# Patient Record
Sex: Female | Born: 1945 | Race: White | Hispanic: No | Marital: Single | Smoking: Former smoker
Health system: Southern US, Community
[De-identification: ages and names within clinical notes are randomized; demographics above are authoritative.]

## PROBLEM LIST (undated history)

## (undated) DIAGNOSIS — F419 Anxiety disorder, unspecified: Secondary | ICD-10-CM

## (undated) DIAGNOSIS — K5790 Diverticulosis of intestine, part unspecified, without perforation or abscess without bleeding: Secondary | ICD-10-CM

## (undated) DIAGNOSIS — T7840XA Allergy, unspecified, initial encounter: Secondary | ICD-10-CM

## (undated) DIAGNOSIS — I10 Essential (primary) hypertension: Secondary | ICD-10-CM

## (undated) DIAGNOSIS — Q438 Other specified congenital malformations of intestine: Secondary | ICD-10-CM

## (undated) DIAGNOSIS — R7303 Prediabetes: Secondary | ICD-10-CM

## (undated) DIAGNOSIS — T4145XA Adverse effect of unspecified anesthetic, initial encounter: Secondary | ICD-10-CM

## (undated) DIAGNOSIS — M810 Age-related osteoporosis without current pathological fracture: Secondary | ICD-10-CM

## (undated) DIAGNOSIS — M8580 Other specified disorders of bone density and structure, unspecified site: Secondary | ICD-10-CM

## (undated) DIAGNOSIS — H269 Unspecified cataract: Secondary | ICD-10-CM

## (undated) DIAGNOSIS — C50919 Malignant neoplasm of unspecified site of unspecified female breast: Secondary | ICD-10-CM

## (undated) DIAGNOSIS — D563 Thalassemia minor: Secondary | ICD-10-CM

## (undated) DIAGNOSIS — E785 Hyperlipidemia, unspecified: Secondary | ICD-10-CM

## (undated) DIAGNOSIS — M1990 Unspecified osteoarthritis, unspecified site: Secondary | ICD-10-CM

## (undated) DIAGNOSIS — C4490 Unspecified malignant neoplasm of skin, unspecified: Secondary | ICD-10-CM

## (undated) DIAGNOSIS — H409 Unspecified glaucoma: Secondary | ICD-10-CM

## (undated) DIAGNOSIS — T8859XA Other complications of anesthesia, initial encounter: Secondary | ICD-10-CM

## (undated) HISTORY — DX: Essential (primary) hypertension: I10

## (undated) HISTORY — DX: Allergy, unspecified, initial encounter: T78.40XA

## (undated) HISTORY — DX: Unspecified glaucoma: H40.9

## (undated) HISTORY — DX: Thalassemia minor: D56.3

## (undated) HISTORY — DX: Unspecified malignant neoplasm of skin, unspecified: C44.90

## (undated) HISTORY — DX: Unspecified cataract: H26.9

## (undated) HISTORY — PX: BREAST BIOPSY: SHX20

## (undated) HISTORY — DX: Unspecified osteoarthritis, unspecified site: M19.90

## (undated) HISTORY — PX: APPENDECTOMY: SHX54

## (undated) HISTORY — DX: Age-related osteoporosis without current pathological fracture: M81.0

---
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
## 1993-02-05 HISTORY — PX: OTHER SURGICAL HISTORY: SHX169

## 1999-02-06 HISTORY — PX: CATARACT EXTRACTION: SUR2

## 2003-02-06 HISTORY — PX: PAROTID GLAND TUMOR EXCISION: SHX5221

## 2003-02-06 HISTORY — PX: COLONOSCOPY: SHX174

## 2003-11-08 ENCOUNTER — Other Ambulatory Visit: Payer: Self-pay

## 2003-11-18 ENCOUNTER — Ambulatory Visit: Payer: Self-pay | Admitting: Otolaryngology

## 2003-12-11 ENCOUNTER — Ambulatory Visit: Payer: Self-pay | Admitting: Internal Medicine

## 2007-12-30 ENCOUNTER — Ambulatory Visit: Payer: Self-pay | Admitting: Ophthalmology

## 2008-01-12 ENCOUNTER — Ambulatory Visit: Payer: Self-pay | Admitting: Ophthalmology

## 2009-02-05 HISTORY — PX: CATARACT EXTRACTION: SUR2

## 2013-01-23 ENCOUNTER — Ambulatory Visit: Payer: Self-pay | Admitting: Otolaryngology

## 2013-02-05 HISTORY — PX: COLONOSCOPY: SHX174

## 2013-06-23 ENCOUNTER — Ambulatory Visit: Payer: Self-pay | Admitting: Gastroenterology

## 2015-09-30 ENCOUNTER — Telehealth: Payer: Self-pay | Admitting: *Deleted

## 2015-09-30 NOTE — Telephone Encounter (Signed)
Received referral for low dose lung cancer screening CT scan. Voicemail left at phone number listed in EMR for patient to call me back to facilitate scheduling scan.  

## 2015-10-12 ENCOUNTER — Telehealth: Payer: Self-pay | Admitting: *Deleted

## 2015-10-12 NOTE — Telephone Encounter (Signed)
Message sent to patient letting her know that she does not meet criteria for screening due to >15 years since her quit date from smoking. And doing screening when criteria is not met risk doing harm with radiation exposure. Encouraged patient to call if she would like for further discussion. 

## 2016-10-03 ENCOUNTER — Other Ambulatory Visit: Payer: Self-pay | Admitting: Obstetrics and Gynecology

## 2016-10-03 DIAGNOSIS — Z1231 Encounter for screening mammogram for malignant neoplasm of breast: Secondary | ICD-10-CM

## 2016-10-24 ENCOUNTER — Encounter: Payer: Self-pay | Admitting: Radiology

## 2016-10-24 ENCOUNTER — Ambulatory Visit
Admission: RE | Admit: 2016-10-24 | Discharge: 2016-10-24 | Disposition: A | Discharge status: Home / Self Care | Payer: Medicare Other | Source: Ambulatory Visit | Attending: Obstetrics and Gynecology | Admitting: Obstetrics and Gynecology

## 2016-10-24 DIAGNOSIS — Z1231 Encounter for screening mammogram for malignant neoplasm of breast: Secondary | ICD-10-CM | POA: Diagnosis not present

## 2016-10-24 DIAGNOSIS — R928 Other abnormal and inconclusive findings on diagnostic imaging of breast: Secondary | ICD-10-CM | POA: Insufficient documentation

## 2016-10-29 ENCOUNTER — Inpatient Hospital Stay
Admission: RE | Admit: 2016-10-29 | Discharge: 2016-10-29 | Disposition: A | Discharge status: Home / Self Care | Payer: Self-pay | Source: Ambulatory Visit | Attending: *Deleted | Admitting: *Deleted

## 2016-10-29 ENCOUNTER — Other Ambulatory Visit: Payer: Self-pay | Admitting: *Deleted

## 2016-10-29 DIAGNOSIS — Z9289 Personal history of other medical treatment: Secondary | ICD-10-CM

## 2016-10-31 ENCOUNTER — Other Ambulatory Visit: Payer: Self-pay | Admitting: Obstetrics and Gynecology

## 2016-10-31 DIAGNOSIS — N6320 Unspecified lump in the left breast, unspecified quadrant: Secondary | ICD-10-CM

## 2016-10-31 DIAGNOSIS — R928 Other abnormal and inconclusive findings on diagnostic imaging of breast: Secondary | ICD-10-CM

## 2016-11-08 ENCOUNTER — Ambulatory Visit
Admission: RE | Admit: 2016-11-08 | Discharge: 2016-11-08 | Disposition: A | Discharge status: Home / Self Care | Payer: Medicare Other | Source: Ambulatory Visit | Attending: Obstetrics and Gynecology | Admitting: Obstetrics and Gynecology

## 2016-11-08 DIAGNOSIS — N6320 Unspecified lump in the left breast, unspecified quadrant: Secondary | ICD-10-CM

## 2016-11-08 DIAGNOSIS — R928 Other abnormal and inconclusive findings on diagnostic imaging of breast: Secondary | ICD-10-CM

## 2016-11-08 DIAGNOSIS — N6321 Unspecified lump in the left breast, upper outer quadrant: Secondary | ICD-10-CM | POA: Diagnosis not present

## 2016-11-12 ENCOUNTER — Other Ambulatory Visit: Payer: Self-pay | Admitting: Obstetrics and Gynecology

## 2016-11-12 DIAGNOSIS — R928 Other abnormal and inconclusive findings on diagnostic imaging of breast: Secondary | ICD-10-CM

## 2016-11-12 DIAGNOSIS — N6320 Unspecified lump in the left breast, unspecified quadrant: Secondary | ICD-10-CM

## 2016-11-21 ENCOUNTER — Ambulatory Visit
Admission: RE | Admit: 2016-11-21 | Discharge: 2016-11-21 | Disposition: A | Discharge status: Home / Self Care | Payer: Medicare Other | Source: Ambulatory Visit | Attending: Obstetrics and Gynecology | Admitting: Obstetrics and Gynecology

## 2016-11-21 DIAGNOSIS — N6320 Unspecified lump in the left breast, unspecified quadrant: Secondary | ICD-10-CM | POA: Diagnosis present

## 2016-11-21 DIAGNOSIS — C50412 Malignant neoplasm of upper-outer quadrant of left female breast: Secondary | ICD-10-CM | POA: Insufficient documentation

## 2016-11-21 DIAGNOSIS — R928 Other abnormal and inconclusive findings on diagnostic imaging of breast: Secondary | ICD-10-CM

## 2016-11-22 LAB — SURGICAL PATHOLOGY

## 2016-11-22 NOTE — Progress Notes (Signed)
  Oncology Nurse Navigator Documentation  Navigator Location: [facility] (11/22/16 1600)   )Navigator Encounter Type: Introductory phone call (11/22/16 1600)   Abnormal Finding Date: 11/08/16 (11/22/16 1600) Confirmed Diagnosis Date: 11/21/16 (11/22/16 1600)               Patient Visit Type: Initial (11/22/16 1600) Treatment Phase: Pre-Tx/Tx Discussion (11/22/16 1600) Barriers/Navigation Needs: Education;Coordination of Care (11/22/16 1600) Education: Coping with Diagnosis/ Prognosis;Accessing Care/ Finding Providers;Newly Diagnosed Cancer Education (11/22/16 1600) Interventions: Coordination of Care;Education (11/22/16 1600)   Coordination of Care: Appts (11/22/16 1600) Education Method: Verbal;Teach-back;Written (11/22/16 1600)                Time Spent with Patient: 45 (11/22/16 1600)   Patient received biopsy results from Radiologist.  Phoned her to introduce navigation service. Scheduled to see Dr. Tamala Julian on 11/27/16 at 1:30.  She will receive Breast Cancer Treatment Handbook/folder with hospital services at that appointment.  Messaged schedulers to schedule Med/Onc visit after Surgical consult.

## 2016-11-27 ENCOUNTER — Other Ambulatory Visit (HOSPITAL_COMMUNITY): Payer: Self-pay | Admitting: Surgery

## 2016-11-28 ENCOUNTER — Encounter: Payer: Self-pay | Admitting: Hematology and Oncology

## 2016-11-28 ENCOUNTER — Inpatient Hospital Stay: Payer: Medicare Other | Attending: Hematology and Oncology | Admitting: Hematology and Oncology

## 2016-11-28 VITALS — BP 138/89 | HR 91 | Temp 98.0°F | Resp 20 | Ht 67.0 in | Wt 193.1 lb

## 2016-11-28 DIAGNOSIS — Z85828 Personal history of other malignant neoplasm of skin: Secondary | ICD-10-CM | POA: Diagnosis not present

## 2016-11-28 DIAGNOSIS — Z79899 Other long term (current) drug therapy: Secondary | ICD-10-CM | POA: Diagnosis not present

## 2016-11-28 DIAGNOSIS — D509 Iron deficiency anemia, unspecified: Secondary | ICD-10-CM

## 2016-11-28 DIAGNOSIS — D563 Thalassemia minor: Secondary | ICD-10-CM

## 2016-11-28 DIAGNOSIS — H409 Unspecified glaucoma: Secondary | ICD-10-CM | POA: Insufficient documentation

## 2016-11-28 DIAGNOSIS — D0512 Intraductal carcinoma in situ of left breast: Secondary | ICD-10-CM

## 2016-11-28 DIAGNOSIS — Z7982 Long term (current) use of aspirin: Secondary | ICD-10-CM | POA: Diagnosis not present

## 2016-11-28 DIAGNOSIS — M8580 Other specified disorders of bone density and structure, unspecified site: Secondary | ICD-10-CM

## 2016-11-28 DIAGNOSIS — I10 Essential (primary) hypertension: Secondary | ICD-10-CM | POA: Diagnosis not present

## 2016-11-28 DIAGNOSIS — Z87891 Personal history of nicotine dependence: Secondary | ICD-10-CM | POA: Diagnosis not present

## 2016-11-28 NOTE — Progress Notes (Signed)
Patient here today as new evaulation regarding high grade DCIS.  Referred by Dr. Wonda Cerise.  Patient accompanied by her sister for today's visit.

## 2016-11-28 NOTE — Progress Notes (Signed)
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 Laverta Baltimore for assessment and management.  HPI:  The patient performs self breast exams.  She undergoes yearly mammograms.  Bilateral screening mammogram on 10/24/2016 revealed a possible mass with distortion in the left breast.  Diagnostic mammogram and ultrasound on 11/08/2016 revealed a 7 x 5 x 3 mm mildly irregular, hypoechoic mass in the 1:30 o'clock position of the left breast. There were areas of questionable distortion in the upper outer left breast and small questionable mass like area in the 3:00 position of the left breast posteriorly. There was no sonographic correlate.  She underwent ultrasound-guided breast biopsy on 11/21/2016.   Clip was placed.  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   She was seen by Dr. Leodis Binet on 11/27/2016.  She was scheduled for bilateral breast MRI.  Discussions were held regarding lumpectomy versus mastectomy. Patient is claustrophobic. She advises that the study will require sedation with Propofol, however they cannot schedule it until 12/10/2016.  Labs on 11/27/2016 revealed a hematocrit 38.1, hemoglobin 12.1, MCV 61.3, platelets 316,000, white count 7500 with an [place] of 4250.    She has a history of thalassemia minor. Patient's father had the same diagnosis, mother did not. There are a total of 5 children born to patient's parents, and 3 of the 5 have a thalassemia diagnosis. Patient has never required blood transfusions.  Symptomatically, patient is doing well overall. She denies bruising or bleeding; no hematochezia, melena, or vaginal bleeding. She last underwent a colonoscopy in 2015. Patient denies B symptoms and interval infections. Patient eating well, with no recent weight loss.   She has a positive family history of  breast cancer. Her mother was diagnosed at age 27. Mother devcloped lymphoma 10 years later. Sister developed uterine cancer at age 75. Maternal grandmother had uterine cancer at an undetermined age.   Menarche was at approximately age 16. Menopause at age 39.  Patient is nulliparous.    Past Medical History:  Diagnosis Date  . Allergy   . Arthritis   . Cataract   . Glaucoma   . Hypertension   . Osteoporosis   . Skin cancer   . Thalassemia minor     Past Surgical History:  Procedure Laterality Date  . ADENOIDECTOMY  1956  . APPENDECTOMY    . basal cell removal  1995  . BREAST BIOPSY Right 1990s   neg. result.   Marland Kitchen CATARACT EXTRACTION Right 2001  . CATARACT EXTRACTION Left 2011  . COLONOSCOPY  2005  . COLONOSCOPY  2015  . PAROTID GLAND TUMOR EXCISION  2005    Family History  Problem Relation Age of Onset  . Breast cancer Mother   . Lymphoma Mother   . Kidney cancer Father   . Endometrial cancer Sister   . Endometrial cancer Maternal Grandmother     Social History:  reports that she quit smoking about 19 years ago. She has a 33.00 pack-year smoking history. She does not have any smokeless tobacco history on file. She reports that she drinks about 3.0 oz of alcohol per week . She reports that she does not use drugs. 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  She likes to hike.  She previously smoked 1 pack/day x 33 years.  She stopped smoking in 08/99.  She drinks 5-7 glasses wine/week.  The patient is accompanied by her sister, Lelon Frohlich, today.  Allergies:  Allergies  Allergen Reactions  . Dust Mite Extract Cough    Also watery eyes and sinus congestion  . Mold Extract [Trichophyton] Cough    Also sinus congestion and watery eyes.  . Statins Other (See Comments)    Muscle pain  . Ativan [Lorazepam] Anxiety  . E-Mycin [Erythromycin] Rash  . Prozac [Fluoxetine Hcl] Anxiety  . Valium [Diazepam] Anxiety  .  Xanax [Alprazolam] Anxiety    Current Medications: Current Outpatient Prescriptions  Medication Sig Dispense Refill  . aspirin 325 MG tablet Take 325 mg by mouth as needed.    . cyclobenzaprine (FLEXERIL) 5 MG tablet Take 5 mg by mouth daily.    . sodium chloride (OCEAN) 0.65 % SOLN nasal spray Place 1 spray into both nostrils as needed for congestion.    . triamterene-hydrochlorothiazide (DYAZIDE) 37.5-25 MG capsule Take 1 capsule by mouth daily.    Marland Kitchen aspirin EC 81 MG tablet Take 81 mg by mouth daily.    . butabarbital (BUTISOL SODIUM) 30 MG tablet Take 30 mg by mouth 2 (two) times daily.    . Calcium Carbonate-Vit D-Min (CALTRATE 600+D PLUS PO) Take 2 tablets by mouth daily.    . diphenhydrAMINE (BENADRYL) 25 mg capsule Take 25 mg by mouth as needed.    Marland Kitchen ibuprofen (ADVIL,MOTRIN) 200 MG tablet Take 200 mg by mouth as needed.    . Misc Natural Products (GLUCOSAMINE CHOND COMPLEX/MSM) TABS Take 1 tablet by mouth 4 (four) times daily.    . Multiple Vitamins-Minerals (CENTRUM SILVER ULTRA WOMENS) TABS Take 1 tablet by mouth daily.    Marland Kitchen oxymetazoline (AFRIN) 0.05 % nasal spray Place 1 spray into the nose 2 (two) times daily.    . TH COD LIVER OIL CAPS Take 1 tablet by mouth daily.    . timolol (TIMOPTIC) 0.5 % ophthalmic solution Place 1 drop into both eyes daily.    . vitamin E 400 UNIT capsule Take 400 Units by mouth daily.     No current facility-administered medications for this visit.     Review of Systems:  GENERAL:  Feels good.  Slight decrease energy associated with thalassemia.  No fevers, sweats or weight loss. PERFORMANCE STATUS (ECOG):  0 HEENT:  Allergies.  No visual changes, runny nose, sore throat, mouth sores or tenderness. Lungs: No shortness of breath or cough.  No hemoptysis. Cardiac:  No chest pain, palpitations, orthopnea, or PND. GI:  No nausea, vomiting, diarrhea, constipation, melena or hematochezia.  Colonoscopy 06/23/2013. GU:  No urgency, frequency, dysuria, or  hematuria. Musculoskeletal:  No back pain.  Arthritis.  No muscle tenderness. Extremities:  No pain or swelling. Skin:  Multiple moles.  No rashes or skin changes. Neuro:  No headache, numbness or weakness, balance or coordination issues. Endocrine:  No diabetes, thyroid issues, hot flashes or night sweats. Psych:  No mood changes, depression or anxiety. Pain:  No focal pain. Review of systems:  All other systems reviewed and found to be negative.  Physical Exam: Blood pressure 138/89, pulse 91, temperature 98 F (36.7 C), temperature source Tympanic, resp. rate 20, height 5\' 7"  (1.702 m), weight 193 lb 2 oz (87.6 kg). GENERAL:  Well developed, well nourished, woman sitting comfortably in the exam room in no acute distress. MENTAL STATUS:  Alert and oriented to person, place and time. HEAD:  Styled shoulder length gray hair.  Normocephalic, atraumatic, face symmetric, no Cushingoid features. EYES:  Glasses.  Blue  eyes.  Pupils equal round and reactive to light and accomodation.  No conjunctivitis or scleral icterus. ENT:  Oropharynx clear without lesion.  Tongue normal. Mucous membranes moist.  RESPIRATORY:  Clear to auscultation without rales, wheezes or rhonchi. CARDIOVASCULAR:  Regular rate and rhythm without murmur, rub or gallop. BREAST:  Right breast without masses, skin changes or nipple discharge.  Left breast s/p biopsy with resolving ecchymosis at the 3 o'clock position.  No masses, skin changes or nipple discharge. ABDOMEN:  Soft, non-tender, with active bowel sounds, and no hepatosplenomegaly.  No masses. SKIN:  Multiple moles.  No rashes, ulcers or lesions. EXTREMITIES: No edema, no skin discoloration or tenderness.  No palpable cords. LYMPH NODES: No palpable cervical, supraclavicular, axillary or inguinal adenopathy  NEUROLOGICAL: Unremarkable. PSYCH:  Appropriate.   No visits with results within 3 Day(s) from this visit.  Latest known visit with results is:  Hospital  Outpatient Visit on 11/21/2016  Component Date Value Ref Range Status  . SURGICAL PATHOLOGY 11/21/2016    Final                   Value:Surgical Pathology CASE: [id number] PATIENT: Sandra Potter Surgical Pathology Report     SPECIMEN SUBMITTED: A. Breast, left, 1:30  CLINICAL HISTORY: Left breast mass  PRE-OPERATIVE DIAGNOSIS: FCC (complicated cyst) vs fat necrosis vs CA  POST-OPERATIVE DIAGNOSIS: None provided.     DIAGNOSIS: A. BREAST, LEFT 1:30; ULTRASOUND GUIDED BIOPSY: - HIGH GRADE DUCTAL CARCINOMA IN SITU, COMEDO TYPE.  Comment: These results were communicated to [facility] in Dr. Reynolds Bowl office on 11/22/2016. Read back procedure was performed. ER and PR will be deferred to an excision specimen.   GROSS DESCRIPTION:  A. The specimen is received in a formalin-filled container labeled with the patient's name and left breast 1:30, 4 cm from nipple.  Core pieces: multiple, aggregate 1.8 x 0.7 x 0.1 cm Comments: yellow to pink fragments, marked green  Entirely submitted in cassette(s): 1  Time/Date in fixative: collected and placed in formalin at 1335 on 11/21/2016 Total                          fixation time: 7 hours   Final Diagnosis performed by Quay Burow, MD.  Electronically signed 11/22/2016 1:33:11PM    The electronic signature indicates that the named Attending Pathologist has evaluated the specimen  Technical component performed at [facility], [address], [place], [place] [address] Lab: [id number] Dir: Darrick Penna. Evette Doffing, MD  Professional component performed at [facility], [facility], [place], [place], [place] [address] Lab: [id number] Dir: Dellia Nims. Reuel Derby, MD      Assessment:  Sandra Potter is a 71 y.o. 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   Bilateral screening mammogram on 10/24/2016 revealed a  possible mass with distortion in the left breast.  Diagnostic mammogram and ultrasound on 11/08/2016 revealed a 7 x 5 x 3 mm mildly irregular, hypoechoic mass in the 1:30 o'clock position of the left breast. There were areas of questionable distortion in the upper outer left breast and small questionable mass like area in the 3:00 position of the left breast posteriorly. There was no sonographic correlate.    She is being scheduled for a breast MRI.  Bone density study on 11/01/2015 revealed osteopenia with  a T-score of -2.3 in the left femoral neck, -2.1 in the left hip, and -0.7 in the AP spine L1-L4.  She has thalassemia minor.  She has a mild microcytic anemia.  Symptomatically, she notes mild fatigue.  Exam is unremarkable except for post biopsy changes in the left breast.  Plan: 1.  Discuss DCIS diagnosis and treatment plan going forward. Discuss surgical options that include lumpectomy versus mastectomy. If she pursues lumpectomy, patient will require radiation. Following surgery, if patient is hormone receptor positive, we will anticipate using aromatase inhibitor x 5 years. Discuss alternative of using tamoxifen given patient's osteopenia.  2.  Breast MRI with sedation scheduled for 12/10/2016.  She has claustrophobia.  MRI to assess additional area of distortion in the left upper outer quadrant. 3.  Discuss bone thinning associated with aromatase inhibitor therapy. Patient has taken Fosamax in the past, however had to discontinue due to painful ambulation. Patient not willing to try another oral bisphosphonate. She is not willing to consider Prolia as an alternative. Patient is taking calcium and vitamin D daily. She remains active.  4.  RTC 3 weeks after breast surgery.  Patient to call for appointment.    Honor Loh, NP  11/28/2016, 3:12 PM   I saw and evaluated the patient, participating in the key portions of the service and reviewing pertinent diagnostic studies and records.  I  reviewed the nurse practitioner's note and agree with the findings and the plan.  The assessment and plan were discussed with the patient.  Multiple questions were asked by the patient and answered.   Nolon Stalls, MD 11/28/2016,5:37 PM

## 2016-11-29 ENCOUNTER — Other Ambulatory Visit (HOSPITAL_COMMUNITY): Payer: Self-pay | Admitting: Surgery

## 2016-11-29 DIAGNOSIS — D0512 Intraductal carcinoma in situ of left breast: Secondary | ICD-10-CM

## 2016-11-30 ENCOUNTER — Other Ambulatory Visit: Payer: Self-pay

## 2016-12-12 NOTE — Pre-Procedure Instructions (Addendum)
    DAYLAH SAYAVONG  12/12/2016      MEDICAL [address] [place], [place] - [place] [address] Phone: [phone] Fax: [phone]    Your procedure is scheduled on Tuesday November 13.  Report to [facility] Admitting at 8:15 A.M.  Call this number if you have problems the morning of surgery:  [phone]   Remember:  Do not eat food or drink liquids after midnight.  Take these medicines the morning of surgery with A SIP OF WATER:   Cyclobenzaprine (flexeril) Afrin nasal spray if needed Ocean nasal spray if needed Benadryl if needed Eye drops if needed Aspirin  7 days prior to procedure STOP taking Vitamin E and multivitamins.      Do not wear jewelry, make-up or nail polish.  Do not wear lotions, powders, or perfumes, or deoderant.  Do not shave 48 hours prior to surgery.  Men may shave face and neck.  Do not bring valuables to the hospital.  [facility] is not responsible for any belongings or valuables.  Contacts, dentures or bridgework may not be worn into surgery.  Leave your suitcase in the car.  After surgery it may be brought to your room.  For patients admitted to the hospital, discharge time will be determined by your treatment team.  Patients discharged the day of surgery will not be allowed to drive home.     Please read over the following fact sheets that you were given. Coughing and Deep Breathing

## 2016-12-13 ENCOUNTER — Encounter (HOSPITAL_COMMUNITY): Payer: Self-pay

## 2016-12-13 ENCOUNTER — Encounter (HOSPITAL_COMMUNITY)
Admission: RE | Admit: 2016-12-13 | Discharge: 2016-12-13 | Disposition: A | Discharge status: Home / Self Care | Payer: Medicare Other | Source: Ambulatory Visit | Attending: Surgery | Admitting: Surgery

## 2016-12-13 ENCOUNTER — Other Ambulatory Visit: Payer: Self-pay

## 2016-12-13 DIAGNOSIS — Z01818 Encounter for other preprocedural examination: Secondary | ICD-10-CM | POA: Diagnosis not present

## 2016-12-13 DIAGNOSIS — I119 Hypertensive heart disease without heart failure: Secondary | ICD-10-CM | POA: Insufficient documentation

## 2016-12-13 DIAGNOSIS — N6320 Unspecified lump in the left breast, unspecified quadrant: Secondary | ICD-10-CM | POA: Insufficient documentation

## 2016-12-13 DIAGNOSIS — Z7982 Long term (current) use of aspirin: Secondary | ICD-10-CM | POA: Insufficient documentation

## 2016-12-13 DIAGNOSIS — Z79899 Other long term (current) drug therapy: Secondary | ICD-10-CM | POA: Insufficient documentation

## 2016-12-13 HISTORY — DX: Other complications of anesthesia, initial encounter: T88.59XA

## 2016-12-13 HISTORY — DX: Adverse effect of unspecified anesthetic, initial encounter: T41.45XA

## 2016-12-13 HISTORY — DX: Malignant neoplasm of unspecified site of unspecified female breast: C50.919

## 2016-12-13 HISTORY — DX: Anxiety disorder, unspecified: F41.9

## 2016-12-13 LAB — BASIC METABOLIC PANEL
Anion gap: 10 (ref 5–15)
BUN: 13 mg/dL (ref 6–20)
CHLORIDE: 93 mmol/L — AB (ref 101–111)
CO2: 25 mmol/L (ref 22–32)
CREATININE: 0.54 mg/dL (ref 0.44–1.00)
Calcium: 9 mg/dL (ref 8.9–10.3)
GFR calc Af Amer: >60 mL/min (ref >60)
GLUCOSE: 105 mg/dL — AB (ref 65–99)
Potassium: 3.6 mmol/L (ref 3.5–5.1)
SODIUM: 128 mmol/L — AB (ref 135–145)

## 2016-12-13 LAB — HEMOGLOBIN A1C
Hgb A1c MFr Bld: 5.8 % — ABNORMAL HIGH (ref 4.8–5.6)
Mean Plasma Glucose: 119.76 mg/dL

## 2016-12-13 LAB — CBC
HEMATOCRIT: 36.8 % (ref 36.0–46.0)
HEMOGLOBIN: 11.8 g/dL — AB (ref 12.0–15.0)
MCH: 19.3 pg — AB (ref 26.0–34.0)
MCHC: 32.1 g/dL (ref 30.0–36.0)
MCV: 60.2 fL — AB (ref 78.0–100.0)
Platelets: 288 10*3/uL (ref 150–400)
RBC: 6.11 MIL/uL — AB (ref 3.87–5.11)
RDW: 17 % — ABNORMAL HIGH (ref 11.5–15.5)
WBC: 6.6 10*3/uL (ref 4.0–10.5)

## 2016-12-13 LAB — GLUCOSE, CAPILLARY: GLUCOSE-CAPILLARY: 118 mg/dL — AB (ref 65–99)

## 2016-12-13 NOTE — Progress Notes (Signed)
PCP - no PCP currently, sees Joellen Jersey - denies  EKG- 12/13/2016  Echo- normal echo more than 10 years ago per patient, denies cardiac history.   Fasting Blood Sugar - 130s fasting, 2 hr after eating states it is less than 120, A1c drawn today.   Patient denies shortness of breath, fever, cough and chest pain at PAT appointment   Patient verbalized understanding of instructions that were given to them at the PAT appointment. Patient was also instructed that they will need to review over the PAT instructions again at home before surgery.

## 2016-12-13 NOTE — Progress Notes (Signed)
   How to Manage Your Diabetes Before and After Surgery  Why is it important to control my blood sugar before and after surgery? . Improving blood sugar levels before and after surgery helps healing and can limit problems. . A way of improving blood sugar control is eating a healthy diet by: o  Eating less sugar and carbohydrates o  Increasing activity/exercise o  Talking with your doctor about reaching your blood sugar goals . High blood sugars (greater than 180 mg/dL) can raise your risk of infections and slow your recovery, so you will need to focus on controlling your diabetes during the weeks before surgery. . Make sure that the doctor who takes care of your diabetes knows about your planned surgery including the date and location.  How do I manage my blood sugar before surgery? . Check your blood sugar at least 4 times a day, starting 2 days before surgery, to make sure that the level is not too high or low. o Check your blood sugar the morning of your surgery when you wake up and every 2 hours until you get to the Short Stay unit. . If your blood sugar is less than 70 mg/dL, you will need to treat for low blood sugar: o Do not take insulin. o Treat a low blood sugar (less than 70 mg/dL) with  cup of clear juice (cranberry or apple), 4 glucose tablets, OR glucose gel. Recheck blood sugar in 15 minutes after treatment (to make sure it is greater than 70 mg/dL). If your blood sugar is not greater than 70 mg/dL on recheck, call [phone] o  for further instructions. . Report your blood sugar to the short stay nurse when you get to Short Stay.  . If you are admitted to the hospital after surgery: o Your blood sugar will be checked by the staff and you will probably be given insulin after surgery (instead of oral diabetes medicines) to make sure you have good blood sugar levels. o The goal for blood sugar control after surgery is 80-180 mg/dL.           

## 2016-12-14 NOTE — Progress Notes (Signed)
Anesthesia Chart Review: Patient is a 71 year old female scheduled for breast MRI with and without contrast on 12/18/16. MRI was ordered by Dr. Leonie Green at [facility] H&P (consult note) from 11/27/16 can be viewed in [facility].   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 For anesthesia history, she reported throat being sore after intubation (smaller ETT suggested ??).  Meds include ASA 81 mg, ASA 325 mg PRN, butabarbital PRN anxiety, Flexeril, Afrin, cod liver oil, Timoptic ophthalmic, Dyazide, vitamin E.  BP (!) 152/77   Pulse 97   Temp 36.9 C (Oral)   Resp 20   Ht 5\' 7"  (1.702 m)   Wt 192 lb (87.1 kg)   SpO2 99%   BMI 30.07 kg/m   EKG 12/13/16: NSR, left atrial enlargement, probable right atrial enlargement. No significant change since last tracing.  Pre-procedure labs noted. Na 128, K 3.6, chloride 93, glucose 105, A1c 5.8, H/H 11.8/36.8, PLT 288. Comparison labs in [facility] show Na 130-134 since 03/30/15, Chloride 93-96 since 03/30/15. Hyponatremia and hypochloremia appear chronic, but sodium a little worse since 10/04/16. Will order an ISTAT8 to check for stability.   If follow-up labs are stable and otherwise not acute changes then I anticipate that she can undergo MRI under anesthesia as scheduled.    George [facility] Short Stay Center/Anesthesiology Phone [phone] 12/14/2016 3:42 PM

## 2016-12-18 ENCOUNTER — Ambulatory Visit (HOSPITAL_COMMUNITY)
Admission: RE | Admit: 2016-12-18 | Discharge: 2016-12-18 | Disposition: A | Discharge status: Home / Self Care | Payer: Medicare Other | Source: Ambulatory Visit | Attending: Surgery | Admitting: Surgery

## 2016-12-18 ENCOUNTER — Encounter (HOSPITAL_COMMUNITY): Admission: RE | Disposition: A | Discharge status: Home / Self Care | Payer: Self-pay | Source: Ambulatory Visit

## 2016-12-18 ENCOUNTER — Ambulatory Visit (HOSPITAL_COMMUNITY): Payer: Medicare Other | Admitting: Vascular Surgery

## 2016-12-18 ENCOUNTER — Encounter (HOSPITAL_COMMUNITY): Payer: Self-pay | Admitting: Certified Registered Nurse Anesthetist

## 2016-12-18 DIAGNOSIS — D569 Thalassemia, unspecified: Secondary | ICD-10-CM | POA: Insufficient documentation

## 2016-12-18 DIAGNOSIS — F419 Anxiety disorder, unspecified: Secondary | ICD-10-CM | POA: Diagnosis not present

## 2016-12-18 DIAGNOSIS — I10 Essential (primary) hypertension: Secondary | ICD-10-CM | POA: Diagnosis not present

## 2016-12-18 DIAGNOSIS — D0512 Intraductal carcinoma in situ of left breast: Secondary | ICD-10-CM | POA: Insufficient documentation

## 2016-12-18 DIAGNOSIS — Z853 Personal history of malignant neoplasm of breast: Secondary | ICD-10-CM | POA: Diagnosis not present

## 2016-12-18 DIAGNOSIS — M1990 Unspecified osteoarthritis, unspecified site: Secondary | ICD-10-CM | POA: Insufficient documentation

## 2016-12-18 DIAGNOSIS — E119 Type 2 diabetes mellitus without complications: Secondary | ICD-10-CM | POA: Diagnosis not present

## 2016-12-18 DIAGNOSIS — Z85828 Personal history of other malignant neoplasm of skin: Secondary | ICD-10-CM | POA: Insufficient documentation

## 2016-12-18 DIAGNOSIS — Z79899 Other long term (current) drug therapy: Secondary | ICD-10-CM | POA: Diagnosis not present

## 2016-12-18 DIAGNOSIS — Z87891 Personal history of nicotine dependence: Secondary | ICD-10-CM | POA: Diagnosis not present

## 2016-12-18 DIAGNOSIS — D649 Anemia, unspecified: Secondary | ICD-10-CM | POA: Diagnosis not present

## 2016-12-18 HISTORY — PX: RADIOLOGY WITH ANESTHESIA: SHX6223

## 2016-12-18 LAB — POCT I-STAT, CHEM 8
BUN: 15 mg/dL (ref 6–20)
CALCIUM ION: 1.16 mmol/L (ref 1.15–1.40)
Chloride: 93 mmol/L — ABNORMAL LOW (ref 101–111)
Creatinine, Ser: 0.6 mg/dL (ref 0.44–1.00)
GLUCOSE: 136 mg/dL — AB (ref 65–99)
HCT: 43 % (ref 36.0–46.0)
Hemoglobin: 14.6 g/dL (ref 12.0–15.0)
Potassium: 3.6 mmol/L (ref 3.5–5.1)
SODIUM: 132 mmol/L — AB (ref 135–145)
TCO2: 28 mmol/L (ref 22–32)

## 2016-12-18 SURGERY — MRI WITH ANESTHESIA
Anesthesia: General

## 2016-12-18 MED ORDER — GADOBENATE DIMEGLUMINE 529 MG/ML IV SOLN
20.0000 mL | Freq: Once | INTRAVENOUS | Status: AC | PRN
  Administered 2016-12-18: 18 mL via INTRAVENOUS

## 2016-12-18 MED ORDER — LACTATED RINGERS IV SOLN: INTRAVENOUS | Status: DC

## 2016-12-18 NOTE — Transfer of Care (Signed)
Immediate Anesthesia Transfer of Care Note  Patient: Sandra Potter  Procedure(s) Performed: BREAST MRI WITH AND WITHOUT CONTRAST (N/A )  Patient Location: PACU  Anesthesia Type:General  Level of Consciousness: alert    Airway & Oxygen Therapy: Patient Spontanous Breathing and Patient connected to nasal cannula oxygen  Post-op Assessment: Report given to RN and Post -op Vital signs reviewed and stable  Post vital signs: Reviewed and stable  Last Vitals:  Vitals:   12/18/16 0819 12/18/16 1157  BP: (!) 148/79 (P) 116/76  Pulse: 100 (P) 97  Resp: 20 (P) 16  Temp: 36.6 C (P) 36.6 C  SpO2: 98% (P) 100%    Last Pain:  Vitals:   12/18/16 0819  TempSrc: Oral         Complications: No apparent anesthesia complications

## 2016-12-18 NOTE — Anesthesia Preprocedure Evaluation (Addendum)
Anesthesia Evaluation  Patient identified by MRN, date of birth, ID band Patient awake    Reviewed: Allergy & Precautions, NPO status , Patient's Chart, lab work & pertinent test results  Airway Mallampati: II  TM Distance: >3 FB Neck ROM: Full    Dental  (+) Dental Advisory Given   Pulmonary former smoker,    Pulmonary exam normal breath sounds clear to auscultation       Cardiovascular hypertension, Pt. on medications Normal cardiovascular exam Rhythm:Regular Rate:Normal     Neuro/Psych Anxiety negative neurological ROS     GI/Hepatic negative GI ROS, Neg liver ROS,   Endo/Other  diabetes, Well ControlledObesity  Renal/GU negative Renal ROS  negative genitourinary   Musculoskeletal  (+) Arthritis ,   Abdominal   Peds  Hematology  (+) Blood dyscrasia, anemia , Thalassemia minor   Anesthesia Other Findings Breast cancer, skin cancer  Reproductive/Obstetrics                             Anesthesia Physical Anesthesia Plan  ASA: III  Anesthesia Plan: General   Post-op Pain Management:    Induction: Intravenous  PONV Risk Score and Plan: 3 and Treatment may vary due to age or medical condition, Ondansetron and Dexamethasone  Airway Management Planned: Oral ETT  Additional Equipment: None  Intra-op Plan:   Post-operative Plan: Extubation in OR  Informed Consent: I have reviewed the patients History and Physical, chart, labs and discussed the procedure including the risks, benefits and alternatives for the proposed anesthesia with the patient or authorized representative who has indicated his/her understanding and acceptance.   Dental advisory given  Plan Discussed with: CRNA  Anesthesia Plan Comments: (No versed)      Anesthesia Quick Evaluation

## 2016-12-18 NOTE — Anesthesia Postprocedure Evaluation (Signed)
Anesthesia Post Note  Patient: Sandra Potter  Procedure(s) Performed: BREAST MRI WITH AND WITHOUT CONTRAST (N/A )     Patient location during evaluation: PACU Anesthesia Type: General Level of consciousness: awake and alert Pain management: pain level controlled Vital Signs Assessment: post-procedure vital signs reviewed and stable Respiratory status: spontaneous breathing, nonlabored ventilation and respiratory function stable Cardiovascular status: blood pressure returned to baseline and stable Postop Assessment: no apparent nausea or vomiting Anesthetic complications: no    Last Vitals:  Vitals:   12/18/16 0819 12/18/16 1157  BP: (!) 148/79 116/76  Pulse: 100 97  Resp: 20 16  Temp: 36.6 C 36.6 C  SpO2: 98% 100%    Last Pain:  Vitals:   12/18/16 1157  TempSrc:   PainSc: 0-No pain                 Audry Pili

## 2016-12-19 ENCOUNTER — Encounter (HOSPITAL_COMMUNITY): Payer: Self-pay | Admitting: Radiology

## 2016-12-21 MED FILL — Lactated Ringer's Solution: INTRAVENOUS | Qty: 1000 | Status: AC

## 2016-12-21 MED FILL — Propofol IV Emul 200 MG/20ML (10 MG/ML): INTRAVENOUS | Qty: 20 | Status: AC

## 2016-12-21 MED FILL — Fentanyl Citrate Preservative Free (PF) Inj 100 MCG/2ML: INTRAMUSCULAR | Qty: 2 | Status: AC

## 2016-12-21 MED FILL — Ondansetron HCl Inj 4 MG/2ML (2 MG/ML): INTRAMUSCULAR | Qty: 2 | Status: AC

## 2016-12-21 MED FILL — Succinylcholine Chloride Sol Pref Syr 200 MG/10ML (20 MG/ML): INTRAVENOUS | Qty: 10 | Status: AC

## 2016-12-21 MED FILL — Phenylephrine HCl IV Soln Pref Syr 0.4 MG/10ML (40 MCG/ML): INTRAVENOUS | Qty: 10 | Status: AC

## 2016-12-21 MED FILL — Lidocaine HCl IV Inj 20 MG/ML: INTRAVENOUS | Qty: 5 | Status: AC

## 2016-12-21 MED FILL — Dexamethasone Sodium Phosphate Inj 10 MG/ML: INTRAMUSCULAR | Qty: 0.5 | Status: AC

## 2016-12-24 ENCOUNTER — Other Ambulatory Visit: Payer: Self-pay | Admitting: Surgery

## 2016-12-24 DIAGNOSIS — D0512 Intraductal carcinoma in situ of left breast: Secondary | ICD-10-CM

## 2016-12-31 ENCOUNTER — Encounter
Admission: RE | Admit: 2016-12-31 | Discharge: 2016-12-31 | Disposition: A | Discharge status: Home / Self Care | Payer: Medicare Other | Source: Ambulatory Visit | Attending: Surgery | Admitting: Surgery

## 2016-12-31 ENCOUNTER — Other Ambulatory Visit: Payer: Self-pay

## 2016-12-31 DIAGNOSIS — R7303 Prediabetes: Secondary | ICD-10-CM | POA: Diagnosis not present

## 2016-12-31 DIAGNOSIS — Z01812 Encounter for preprocedural laboratory examination: Secondary | ICD-10-CM | POA: Insufficient documentation

## 2016-12-31 DIAGNOSIS — D0512 Intraductal carcinoma in situ of left breast: Secondary | ICD-10-CM | POA: Insufficient documentation

## 2016-12-31 DIAGNOSIS — D563 Thalassemia minor: Secondary | ICD-10-CM | POA: Diagnosis not present

## 2016-12-31 DIAGNOSIS — I10 Essential (primary) hypertension: Secondary | ICD-10-CM | POA: Diagnosis not present

## 2016-12-31 HISTORY — DX: Prediabetes: R73.03

## 2016-12-31 LAB — PROTIME-INR
INR: 0.94
Prothrombin Time: 12.5 s (ref 11.4–15.2)

## 2016-12-31 LAB — APTT: aPTT: 32 seconds (ref 24–36)

## 2016-12-31 NOTE — Patient Instructions (Signed)
Your procedure is scheduled on: Tuesday 01/08/17 Report to [facility]. 2ND FLOOR MEDICAL MALL ENTRANCE. To find out your arrival time please call [phone] between 1PM - 3PM on Monday 01/07/17.  Remember: Instructions that are not followed completely may result in serious medical risk, up to and including death, or upon the discretion of your surgeon and anesthesiologist your surgery may need to be rescheduled.    __X__ 1. Do not eat anything after midnight the night before your    procedure.  No gum chewing or hard candies.  You may drink clear   liquids up to 2 hours before you are scheduled to arrive at the   hospital for your procedure. Do not drink clear liquids within 2   hours of scheduled arrival to the hospital as this may lead to your   procedure being delayed or rescheduled.       Clear liquids include:   Water or Apple juice without pulp   Clear carbohydrate beverage such as Clearfast or Gatorade   Black coffee or Clear Tea (no milk, no creamer, do not add anything   to the coffee or tea)    Diabetics should only drink water   __X__ 2. No Alcohol for 24 hours before or after surgery.   ____ 3. Bring all medications with you on the day of surgery if instructed.    __X__ 4. Notify your doctor if there is any change in your medical condition     (cold, fever, infections).             __X___5. No smoking within 24 hours of your surgery.     Do not wear jewelry, make-up, hairpins, clips or nail polish.  Do not wear lotions, powders, or perfumes.   Do not shave 48 hours prior to surgery. Men may shave face and neck.  Do not bring valuables to the hospital.    [facility] is not responsible for any belongings or valuables.               Contacts, dentures or bridgework may not be worn into surgery.  Leave your suitcase in the car. After surgery it may be brought to your room.  For patients admitted to the hospital, discharge time is determined by your                treatment  team.   Patients discharged the day of surgery will not be allowed to drive home.   Please read over the following fact sheets that you were given:   MRSA Information   __X__ Take these medicines the morning of surgery with A SIP OF WATER:    1. NONE  2.   3.   4.  5.  6.  ____ Fleet Enema (as directed)   __X__ Use CHG Soap/SAGE wipes as directed  ____ Use inhalers on the day of surgery  ____ Stop metformin 2 days prior to surgery    ____ Take 1/2 of usual insulin dose the night before surgery and none on the morning of surgery.   __X__ Stop Coumadin/Plavix/aspirin on AS INSTRUCTED BY DR Tamala Julian  __X__ Stop Anti-inflammatories such as Advil, Aleve, Ibuprofen, Motrin, Naproxen, Naprosyn, Goodies,powder, or aspirin products.  OK to take Tylenol.   __X__ Stop supplements, Vitamin E, Fish Oil until after surgery.    ____ Bring C-Pap to the hospital.

## 2017-01-08 ENCOUNTER — Ambulatory Visit: Payer: Medicare Other | Admitting: Anesthesiology

## 2017-01-08 ENCOUNTER — Encounter: Payer: Self-pay | Admitting: *Deleted

## 2017-01-08 ENCOUNTER — Ambulatory Visit
Admission: RE | Admit: 2017-01-08 | Discharge: 2017-01-08 | Disposition: A | Discharge status: Home / Self Care | Payer: Medicare Other | Source: Ambulatory Visit | Attending: Surgery | Admitting: Surgery

## 2017-01-08 ENCOUNTER — Observation Stay
Admission: RE | Admit: 2017-01-08 | Discharge: 2017-01-09 | Disposition: A | Discharge status: Home / Self Care | Payer: Medicare Other | Source: Ambulatory Visit | Attending: Surgery | Admitting: Surgery

## 2017-01-08 ENCOUNTER — Encounter
Admission: RE | Disposition: A | Discharge status: Home / Self Care | Payer: Self-pay | Source: Ambulatory Visit | Attending: Surgery

## 2017-01-08 ENCOUNTER — Other Ambulatory Visit: Payer: Self-pay

## 2017-01-08 ENCOUNTER — Encounter: Payer: Self-pay | Admitting: Anesthesiology

## 2017-01-08 DIAGNOSIS — D0512 Intraductal carcinoma in situ of left breast: Principal | ICD-10-CM | POA: Insufficient documentation

## 2017-01-08 DIAGNOSIS — N6081 Other benign mammary dysplasias of right breast: Secondary | ICD-10-CM | POA: Insufficient documentation

## 2017-01-08 DIAGNOSIS — E119 Type 2 diabetes mellitus without complications: Secondary | ICD-10-CM | POA: Diagnosis not present

## 2017-01-08 DIAGNOSIS — N6021 Fibroadenosis of right breast: Secondary | ICD-10-CM | POA: Insufficient documentation

## 2017-01-08 DIAGNOSIS — L821 Other seborrheic keratosis: Secondary | ICD-10-CM | POA: Diagnosis not present

## 2017-01-08 DIAGNOSIS — I10 Essential (primary) hypertension: Secondary | ICD-10-CM | POA: Insufficient documentation

## 2017-01-08 DIAGNOSIS — N6082 Other benign mammary dysplasias of left breast: Secondary | ICD-10-CM | POA: Insufficient documentation

## 2017-01-08 DIAGNOSIS — Z87891 Personal history of nicotine dependence: Secondary | ICD-10-CM | POA: Insufficient documentation

## 2017-01-08 DIAGNOSIS — N6022 Fibroadenosis of left breast: Secondary | ICD-10-CM | POA: Insufficient documentation

## 2017-01-08 DIAGNOSIS — Z79899 Other long term (current) drug therapy: Secondary | ICD-10-CM | POA: Insufficient documentation

## 2017-01-08 DIAGNOSIS — D0590 Unspecified type of carcinoma in situ of unspecified breast: Secondary | ICD-10-CM | POA: Diagnosis present

## 2017-01-08 HISTORY — PX: SENTINEL NODE BIOPSY: SHX6608

## 2017-01-08 HISTORY — PX: SIMPLE MASTECTOMY WITH AXILLARY SENTINEL NODE BIOPSY: SHX6098

## 2017-01-08 HISTORY — PX: MOLE REMOVAL: SHX2046

## 2017-01-08 LAB — POCT I-STAT 4, (NA,K, GLUC, HGB,HCT)
GLUCOSE: 120 mg/dL — AB (ref 65–99)
HCT: 40 % (ref 36.0–46.0)
Hemoglobin: 13.6 g/dL (ref 12.0–15.0)
POTASSIUM: 4.4 mmol/L (ref 3.5–5.1)
Sodium: 133 mmol/L — ABNORMAL LOW (ref 135–145)

## 2017-01-08 LAB — GLUCOSE, CAPILLARY
GLUCOSE-CAPILLARY: 120 mg/dL — AB (ref 65–99)
Glucose-Capillary: 150 mg/dL — ABNORMAL HIGH (ref 65–99)

## 2017-01-08 SURGERY — SIMPLE MASTECTOMY
Anesthesia: General | Laterality: Right

## 2017-01-08 MED ORDER — DIPHENHYDRAMINE HCL 25 MG PO CAPS: 25.0000 mg | ORAL_CAPSULE | Freq: Every day | ORAL | Status: DC | PRN

## 2017-01-08 MED ORDER — DEXMEDETOMIDINE HCL IN NACL 200 MCG/50ML IV SOLN
INTRAVENOUS | Status: DC | PRN
  Administered 2017-01-08: 10 ug via INTRAVENOUS

## 2017-01-08 MED ORDER — ONDANSETRON HCL 4 MG/2ML IJ SOLN: 4.0000 mg | Freq: Four times a day (QID) | INTRAMUSCULAR | Status: DC | PRN

## 2017-01-08 MED ORDER — ACETAMINOPHEN 650 MG RE SUPP: 650.0000 mg | Freq: Four times a day (QID) | RECTAL | Status: DC | PRN

## 2017-01-08 MED ORDER — IBUPROFEN 400 MG PO TABS: 400.0000 mg | ORAL_TABLET | Freq: Four times a day (QID) | ORAL | Status: DC | PRN

## 2017-01-08 MED ORDER — MIDAZOLAM HCL 2 MG/2ML IJ SOLN
INTRAMUSCULAR | Status: AC
  Filled 2017-01-08: qty 2

## 2017-01-08 MED ORDER — CYCLOBENZAPRINE HCL 10 MG PO TABS: 5.0000 mg | ORAL_TABLET | Freq: Three times a day (TID) | ORAL | Status: DC | PRN

## 2017-01-08 MED ORDER — ROCURONIUM BROMIDE 100 MG/10ML IV SOLN
INTRAVENOUS | Status: DC | PRN
  Administered 2017-01-08: 40 mg via INTRAVENOUS
  Administered 2017-01-08: 10 mg via INTRAVENOUS

## 2017-01-08 MED ORDER — PHENYLEPHRINE HCL 10 MG/ML IJ SOLN
INTRAMUSCULAR | Status: DC | PRN
  Administered 2017-01-08 (×10): 100 ug via INTRAVENOUS

## 2017-01-08 MED ORDER — CALCIUM CARBONATE-VITAMIN D 500-200 MG-UNIT PO TABS
1.0000 | ORAL_TABLET | Freq: Two times a day (BID) | ORAL | Status: DC
  Administered 2017-01-08 – 2017-01-09 (×2): 1 via ORAL
  Filled 2017-01-08 (×2): qty 1

## 2017-01-08 MED ORDER — TIMOLOL MALEATE 0.5 % OP SOLN
1.0000 [drp] | Freq: Every day | OPHTHALMIC | Status: DC
  Administered 2017-01-08: 1 [drp] via OPHTHALMIC
  Filled 2017-01-08: qty 5

## 2017-01-08 MED ORDER — FAMOTIDINE 20 MG PO TABS
ORAL_TABLET | ORAL | Status: AC
  Administered 2017-01-08: 20 mg via ORAL
  Filled 2017-01-08: qty 1

## 2017-01-08 MED ORDER — FENTANYL CITRATE (PF) 100 MCG/2ML IJ SOLN
INTRAMUSCULAR | Status: AC
  Filled 2017-01-08: qty 2

## 2017-01-08 MED ORDER — ADULT MULTIVITAMIN W/MINERALS CH
1.0000 | ORAL_TABLET | Freq: Every day | ORAL | Status: DC
  Administered 2017-01-09: 1 via ORAL
  Filled 2017-01-08: qty 1

## 2017-01-08 MED ORDER — ONDANSETRON HCL 4 MG/2ML IJ SOLN
INTRAMUSCULAR | Status: AC
  Filled 2017-01-08: qty 2

## 2017-01-08 MED ORDER — PROPOFOL 10 MG/ML IV BOLUS
INTRAVENOUS | Status: DC | PRN
  Administered 2017-01-08: 160 mg via INTRAVENOUS

## 2017-01-08 MED ORDER — FENTANYL CITRATE (PF) 100 MCG/2ML IJ SOLN
INTRAMUSCULAR | Status: AC
Start: 2017-01-08 — End: 2017-01-08
  Filled 2017-01-08: qty 2

## 2017-01-08 MED ORDER — OXYMETAZOLINE HCL 0.05 % NA SOLN
2.0000 | Freq: Two times a day (BID) | NASAL | Status: DC | PRN
  Filled 2017-01-08: qty 15

## 2017-01-08 MED ORDER — ONDANSETRON 4 MG PO TBDP: 4.0000 mg | ORAL_TABLET | Freq: Four times a day (QID) | ORAL | Status: DC | PRN

## 2017-01-08 MED ORDER — ONDANSETRON HCL 4 MG/2ML IJ SOLN: 4.0000 mg | Freq: Once | INTRAMUSCULAR | Status: DC | PRN

## 2017-01-08 MED ORDER — DEXTROSE-NACL 5-0.2 % IV SOLN
INTRAVENOUS | Status: DC
Start: 2017-01-08 — End: 2017-01-09
  Administered 2017-01-08: 17:00:00 via INTRAVENOUS

## 2017-01-08 MED ORDER — ONDANSETRON HCL 4 MG/2ML IJ SOLN
INTRAMUSCULAR | Status: DC | PRN
  Administered 2017-01-08: 4 mg via INTRAVENOUS

## 2017-01-08 MED ORDER — SALINE SPRAY 0.65 % NA SOLN
2.0000 | NASAL | Status: DC | PRN
  Filled 2017-01-08: qty 44

## 2017-01-08 MED ORDER — LACTATED RINGERS IV SOLN
INTRAVENOUS | Status: DC | PRN
  Administered 2017-01-08: 11:00:00 via INTRAVENOUS

## 2017-01-08 MED ORDER — FENTANYL CITRATE (PF) 100 MCG/2ML IJ SOLN
25.0000 ug | INTRAMUSCULAR | Status: DC | PRN
  Administered 2017-01-08 (×3): 25 ug via INTRAVENOUS

## 2017-01-08 MED ORDER — BUTABARBITAL SODIUM 30 MG PO TABS: 30.0000 mg | ORAL_TABLET | Freq: Two times a day (BID) | ORAL | Status: DC | PRN

## 2017-01-08 MED ORDER — TECHNETIUM TC 99M SULFUR COLLOID FILTERED
0.7900 | Freq: Once | INTRAVENOUS | Status: AC | PRN
  Administered 2017-01-08: 0.79 via INTRADERMAL

## 2017-01-08 MED ORDER — ACETAMINOPHEN 325 MG PO TABS: 650.0000 mg | ORAL_TABLET | Freq: Four times a day (QID) | ORAL | Status: DC | PRN

## 2017-01-08 MED ORDER — DEXAMETHASONE SODIUM PHOSPHATE 10 MG/ML IJ SOLN
INTRAMUSCULAR | Status: DC | PRN
  Administered 2017-01-08: 10 mg via INTRAVENOUS

## 2017-01-08 MED ORDER — GLUCOSAMINE CHOND COMPLEX/MSM PO TABS: 2.0000 | ORAL_TABLET | Freq: Two times a day (BID) | ORAL | Status: DC

## 2017-01-08 MED ORDER — PROPOFOL 10 MG/ML IV BOLUS
INTRAVENOUS | Status: AC
  Filled 2017-01-08: qty 20

## 2017-01-08 MED ORDER — FENTANYL CITRATE (PF) 100 MCG/2ML IJ SOLN
INTRAMUSCULAR | Status: DC | PRN
  Administered 2017-01-08 (×4): 50 ug via INTRAVENOUS

## 2017-01-08 MED ORDER — HYDROCODONE-ACETAMINOPHEN 5-325 MG PO TABS
1.0000 | ORAL_TABLET | ORAL | Status: DC | PRN
  Administered 2017-01-08: 2 via ORAL
  Administered 2017-01-08 (×2): 1 via ORAL
  Administered 2017-01-09 (×3): 2 via ORAL
  Filled 2017-01-08 (×4): qty 2
  Filled 2017-01-08 (×2): qty 1

## 2017-01-08 MED ORDER — SODIUM CHLORIDE 0.9 % IV SOLN
INTRAVENOUS | Status: DC
  Administered 2017-01-08: 10:00:00 via INTRAVENOUS

## 2017-01-08 MED ORDER — MORPHINE SULFATE (PF) 2 MG/ML IV SOLN: 1.0000 mg | INTRAVENOUS | Status: DC | PRN

## 2017-01-08 MED ORDER — FAMOTIDINE 20 MG PO TABS
20.0000 mg | ORAL_TABLET | Freq: Once | ORAL | Status: AC
  Administered 2017-01-08: 20 mg via ORAL

## 2017-01-08 MED ORDER — TRIAMTERENE-HCTZ 37.5-25 MG PO TABS
1.0000 | ORAL_TABLET | Freq: Every day | ORAL | Status: DC
  Administered 2017-01-09: 1 via ORAL
  Filled 2017-01-08 (×2): qty 1

## 2017-01-08 SURGICAL SUPPLY — 40 items
BULB RESERV EVAC DRAIN JP 100C (MISCELLANEOUS) ×15 IMPLANT
CANISTER SUCT 1200ML W/VALVE (MISCELLANEOUS) ×5 IMPLANT
CHLORAPREP W/TINT 26ML (MISCELLANEOUS) ×5 IMPLANT
CNTNR SPEC 2.5X3XGRAD LEK (MISCELLANEOUS) ×9
CONT SPEC 4OZ STER OR WHT (MISCELLANEOUS) ×6
CONTAINER SPEC 2.5X3XGRAD LEK (MISCELLANEOUS) ×9 IMPLANT
COUNTER NEEDLE 20/40 LG (NEEDLE) ×10 IMPLANT
DERMABOND ADVANCED (GAUZE/BANDAGES/DRESSINGS) ×6
DERMABOND ADVANCED .7 DNX12 (GAUZE/BANDAGES/DRESSINGS) ×9 IMPLANT
DRAIN CHANNEL JP 15F RND 16 (MISCELLANEOUS) ×10 IMPLANT
DRAIN CHANNEL JP 19F (MISCELLANEOUS) ×20 IMPLANT
DRAPE LAPAROTOMY TRNSV 106X77 (MISCELLANEOUS) ×5 IMPLANT
ELECT REM PT RETURN 9FT ADLT (ELECTROSURGICAL) ×5
ELECTRODE REM PT RTRN 9FT ADLT (ELECTROSURGICAL) ×3 IMPLANT
GAUZE SPONGE 4X4 12PLY STRL (GAUZE/BANDAGES/DRESSINGS) ×5 IMPLANT
GLOVE BIO SURGEON STRL SZ7.5 (GLOVE) ×5 IMPLANT
GOWN STRL REUS W/ TWL LRG LVL3 (GOWN DISPOSABLE) ×9 IMPLANT
GOWN STRL REUS W/TWL LRG LVL3 (GOWN DISPOSABLE) ×6
KIT RM TURNOVER STRD PROC AR (KITS) ×5 IMPLANT
LABEL OR SOLS (LABEL) ×5 IMPLANT
NDL SAFETY ECLIPSE 18X1.5 (NEEDLE) ×3 IMPLANT
NEEDLE HYPO 18GX1.5 SHARP (NEEDLE) ×2
NEEDLE HYPO 22GX1.5 SAFETY (NEEDLE) ×5 IMPLANT
PACK BASIN MAJOR ARMC (MISCELLANEOUS) ×5 IMPLANT
SLEVE PROBE SENORX GAMMA FIND (MISCELLANEOUS) ×5 IMPLANT
SUT CHROMIC 3 0 SH 27 (SUTURE) ×15 IMPLANT
SUT CHROMIC 4 0 RB 1X27 (SUTURE) ×5 IMPLANT
SUT ETHILON 3-0 FS-10 30 BLK (SUTURE) ×5
SUT ETHILON 4-0 (SUTURE)
SUT ETHILON 4-0 FS2 18XMFL BLK (SUTURE)
SUT MNCRL 4-0 (SUTURE) ×8
SUT MNCRL 4-0 27XMFL (SUTURE) ×12
SUT SILK 3-0 (SUTURE) ×4
SUT SILK 3-0 SH-1 18XCR BRD (SUTURE) ×6
SUT VICRYL+ 3-0 144IN (SUTURE) ×5 IMPLANT
SUTURE EHLN 3-0 FS-10 30 BLK (SUTURE) ×3 IMPLANT
SUTURE ETHLN 4-0 FS2 18XMF BLK (SUTURE) IMPLANT
SUTURE MNCRL 4-0 27XMF (SUTURE) ×12 IMPLANT
SUTURE SILK 3-0 SH-1 18XCR BRD (SUTURE) ×6 IMPLANT
SYR 10ML LL (SYRINGE) ×5 IMPLANT

## 2017-01-08 NOTE — Plan of Care (Signed)
Pt admitted from PACU. Pain has been controlled.

## 2017-01-08 NOTE — H&P (Signed)
  She comes in prepared for left mastectomy with sentinel lymph node biopsy possible left modified radical mastectomy.  We are also planning a right simple mastectomy and excision of a seborrheic keratosis of the right chest wall.  She has had injection of radioactive technetium sulfur colloid on the left side.  She reports no change in overall condition since the recent office exam.  Lab work reviewed  I discussed the plan for surgery.  Both sides were marked YES

## 2017-01-08 NOTE — Progress Notes (Signed)
Ice water given, patient tolerated well.

## 2017-01-08 NOTE — Progress Notes (Signed)
PHARMACIST - PHYSICIAN ORDER COMMUNICATION  CONCERNING: P&T Medication Policy on Herbal Medications  DESCRIPTION:  This patient's order for:  Glucosamine-choindroitin  has been noted.  This product(s) is classified as an "herbal" or natural product. Due to a lack of definitive safety studies or FDA approval, nonstandard manufacturing practices, plus the potential risk of unknown drug-drug interactions while on inpatient medications, the Pharmacy and Therapeutics Committee does not permit the use of "herbal" or natural products of this type within [facility].   ACTION TAKEN: The pharmacy department is unable to verify this order at this time and your patient has been informed of this safety policy. Please reevaluate patient's clinical condition at discharge and address if the herbal or natural product(s) should be resumed at that time.

## 2017-01-08 NOTE — Anesthesia Preprocedure Evaluation (Signed)
Anesthesia Evaluation  Patient identified by MRN, date of birth, ID band Patient awake    Reviewed: Allergy & Precautions, NPO status , Patient's Chart, lab work & pertinent test results  Airway Mallampati: II       Dental  (+) Teeth Intact   Pulmonary neg pulmonary ROS, former smoker,    breath sounds clear to auscultation       Cardiovascular Exercise Tolerance: Good hypertension, Pt. on medications  Rhythm:Regular     Neuro/Psych Anxiety    GI/Hepatic negative GI ROS, Neg liver ROS,   Endo/Other  negative endocrine ROS  Renal/GU negative Renal ROS     Musculoskeletal negative musculoskeletal ROS (+)   Abdominal Normal abdominal exam  (+)   Peds negative pediatric ROS (+)  Hematology  (+) anemia ,   Anesthesia Other Findings   Reproductive/Obstetrics                             Anesthesia Physical Anesthesia Plan  ASA: II  Anesthesia Plan: General   Post-op Pain Management:    Induction: Intravenous  PONV Risk Score and Plan: Ondansetron and Dexamethasone  Airway Management Planned: Oral ETT  Additional Equipment:   Intra-op Plan:   Post-operative Plan: Extubation in OR  Informed Consent:   Plan Discussed with: CRNA  Anesthesia Plan Comments:         Anesthesia Quick Evaluation

## 2017-01-08 NOTE — Progress Notes (Signed)
She reports minimal pain after surgery.  She is awake alert and oriented.  She has started on something to drink.  Her wounds appear to be healing satisfactorily.  Drains are all functioning.  I discussed the surgery and plan for postoperative care.  Plan to keep her overnight for a period of observation.

## 2017-01-08 NOTE — Anesthesia Procedure Notes (Signed)
Procedure Name: Intubation Date/Time: 01/08/2017 10:39 AM Performed by: Justus Memory, CRNA Pre-anesthesia Checklist: Patient identified, Patient being monitored, Timeout performed, Emergency Drugs available and Suction available Patient Re-evaluated:Patient Re-evaluated prior to induction Oxygen Delivery Method: Circle system utilized Preoxygenation: Pre-oxygenation with 100% oxygen Induction Type: IV induction Ventilation: Mask ventilation without difficulty Laryngoscope Size: Mac and 3 Grade View: Grade I Tube type: Oral Tube size: 7.0 mm Number of attempts: 1 Airway Equipment and Method: Stylet Placement Confirmation: ETT inserted through vocal cords under direct vision,  positive ETCO2 and breath sounds checked- equal and bilateral Secured at: 21 cm Tube secured with: Tape Dental Injury: Teeth and Oropharynx as per pre-operative assessment

## 2017-01-08 NOTE — Anesthesia Post-op Follow-up Note (Signed)
Anesthesia QCDR form completed.        

## 2017-01-08 NOTE — Transfer of Care (Signed)
Immediate Anesthesia Transfer of Care Note  Patient: Sandra Potter  Procedure(s) Performed: Procedure(s): SIMPLE MASTECTOMY (Bilateral) MASTECTOMY MODIFIED RADICAL (Bilateral) SENTINEL NODE BIOPSY (Bilateral) REMOVAL OF SEBORRHEIC KERATOSES, RIGHT BREAST (Right)  Patient Location: PACU  Anesthesia Type:General  Level of Consciousness: sedated  Airway & Oxygen Therapy: Patient Spontanous Breathing and Patient connected to face mask oxygen  Post-op Assessment: Report given to RN and Post -op Vital signs reviewed and stable  Post vital signs: Reviewed and stable  Last Vitals:  Vitals:   01/08/17 0857 01/08/17 1517  BP: 134/73 123/84  Pulse: 92 (!) 108  Resp: 16 17  Temp: (!) 36.2 C 36.7 C  SpO2: 612% 24%    Complications: No apparent anesthesia complications

## 2017-01-08 NOTE — Op Note (Signed)
OPERATIVE REPORT  PREOPERATIVE  DIAGNOSIS: .  Ductal carcinoma in situ of  left breast, seborrheic keratosis  POSTOPERATIVE DIAGNOSIS: .  Ductal carcinoma in situ of left breast, seborrheic keratosis  PROCEDURE: .  Bilateral mastectomy and left axillary sentinel lymph node biopsy, excision seborrheic keratosis  ANESTHESIA:  General  SURGEON: Rochel Brome  MD  ASSISTANT SURGEON:  Herbert Pun MD   INDICATIONS: .Marland Kitchen  She had recent abnormal mammogram demonstrating abnormality in the upper outer quadrant of the left breast.  Needle biopsy demonstrated ductal carcinoma in situ.  MRI demonstrated additional areas of non-mass enhancement.  She elected to have bilateral mastectomy for definitive treatment.  Also left axillary sentinel lymph node was recommended.  She also asked for removal of the seborrheic keratosis of the right anterior chest wall.  With the patient on the operating table in the supine position under general anesthesia both arms were placed on the lateral arm supports.  Both breasts and surrounding chest wall and upper arms were prepared with ChloraPrep and draped in a sterile manner.  Both breasts were very large in size.  First on the left side a curvilinear incision was made from the upper outer quadrant to the lower inner quadrant above and below the breast carried down through subcutaneous tissues.  Silk sutures were placed at skin edges for traction.  Skin and subcutaneous flaps were raised in the direction of the clavicle also towards the sternum also to the inferior mammary fold and also to the latissimus dorsi muscle.  Dissection was carried up into the axilla.  The gamma counter was used to demonstrate the location of the sentinel lymph node.  The lymph node found was approximately 8 mm in size and appeared typical.  It was removed with some surrounding fatty tissue.  The ex vivo counts per second were greater than 2000.  The background count was less than 30.  There  was no remaining visible or palpable mass in the axilla.  The lymph node was submitted for frozen section and later the pathologist called to report that no cancer was seen on frozen section  The breast was elevated off the underlying fascia using electrocautery for hemostasis with dissection was carried up to the inferior aspect of the axilla completing a simple mastectomy.  The upper lateral tip of the skin ellipse was tagged with a stitch and the breast was sent fresh for routine pathology.  The wound was inspected and several small bleeding points were cauterized.  The wound was irrigated with saline.  It is noted that during the course of the procedure of a number of clamped bleeding points were suture ligated with 3-0 chromic and also several vessels in the lower axilla were ligated with 3-0 chromic.    Two 19 French Blake drains were placed through separate inferior stab wounds were placed along the anterior chest wall and one extended up into the axilla.  These were sutured to the skin with 3-0 nylon.  The wound was closed with running 4-0 Monocryl subcuticular suture.  There was redundancy of skin inferiorly and a V-shaped segment of skin was excised which was approximately 5 cm in dimension.  This defect was also closed with running 4-0 Monocryl.  After completion of skin closure the wound was treated with Dermabond and the drains were activated seeing a small amount of serosanguineous drainage.  The patient appeared to be in satisfactory condition and attention was turned to the right breast.  A simple mastectomy which was carried  out in a similar manner using electrocautery for hemostasis and a number of bleeding points were suture ligated with 3-0 chromic and silk sutures were used for traction.  Similar skin and subcutaneous flaps were raised.  Similar dissection was carried out dissecting the breast away from the underlying fascia and extending up into the lower aspect of the axilla.  The upper  outer end of the skin ellipse was also tagged with a stitch for orientation.  This was submitted fresh for routine pathology.  The wound was also inspected and irrigated.  2 Blake drains were inserted in a similar manner.  Wound was closed in a similar manner and also removed the V-shaped segment of redundant skin at the inferior aspect of the wound.  The wound was also treated with Dermabond.  The drains were activated draining a small amount of serous  Also a seborrheic keratosis measuring some 2.5 cm in dimension was removed from the skin which was just below and medial to the skin incision this was not submitted for pathology.  This wound was also dressed with Dermabond.  Estimated blood loss 100 cc.  This operation took more than usual effort due to very large size of the breast and lasted 3 hours and 50 minutes  She tolerated the procedure satisfactorily and was prepared for transfer to the recovery room for postoperative care  Rochel Brome MD

## 2017-01-08 NOTE — Progress Notes (Signed)
15 minute cal to floor.

## 2017-01-09 ENCOUNTER — Encounter: Payer: Self-pay | Admitting: Surgery

## 2017-01-09 DIAGNOSIS — D0512 Intraductal carcinoma in situ of left breast: Secondary | ICD-10-CM | POA: Diagnosis not present

## 2017-01-09 MED ORDER — HYDROCODONE-ACETAMINOPHEN 5-325 MG PO TABS: 1.0000 | ORAL_TABLET | Freq: Four times a day (QID) | ORAL | 0 refills | Status: DC | PRN

## 2017-01-09 NOTE — Plan of Care (Signed)
Patient continues to progress, transferred from bed to bedside commode without issues.

## 2017-01-09 NOTE — Care Management Obs Status (Signed)
MEDICARE OBSERVATION STATUS NOTIFICATION   Patient Details  Name: Sandra Potter MRN: [id number] Date of Birth: [birth year]   Medicare Observation Status Notification Given:  No(Admitted obs less than 24 hours)    Beverly Sessions, RN 01/09/2017, 1:57 PM

## 2017-01-09 NOTE — Progress Notes (Signed)
Pt discharged per MD order. JP drain care reviewed with pt. Family demonstrated how to properly drain and measure drainage. Prescription given to pt. IV removed. Discharge instructions reviewed with pt. Questions answered to pt satisfaction. Pt taken to car in wheelchair pushed by volunteer.

## 2017-01-09 NOTE — Discharge Summary (Signed)
DISCHARGE SUMMARY  DIAGNOSIS: Ductal carcinoma in situ of the left breast  OPERATION: Bilateral mastectomy with left axillary sentinel lymph node biopsy and excision of seborrheic keratosis  She was kept overnight for a period of observation and appears to be making satisfactory progress.  Wounds are healing satisfactorily.  Dressings are dry.  Blake drains are functioning satisfactorily.  She has walked in the hallway.  She is tolerating her diet well.  She is having minimal degree of postoperative pain.  I have discussed discharge instructions in some detail.  Plan is for follow-up in the office

## 2017-01-09 NOTE — Progress Notes (Signed)
She is doing well, hungry, moderate incisional pain, ambulating to bedside commode.  Wounds appear to be healing, some bruising.  Discussed drain care.  Plan regular diet, walk in hallway.   Probable discharge later today.

## 2017-01-09 NOTE — Progress Notes (Signed)
  Oncology Nurse Navigator Documentation  Navigator Location: [facility] (01/09/17 1000)   )Navigator Encounter Type: Other(post-op) (01/09/17 1000)       Surgery Date: 01/08/17 (01/09/17 1000)             Patient Visit Type: Inpatient (01/09/17 1000)   Barriers/Navigation Needs: Education (01/09/17 1000) Education: Lymphedema class (01/09/17 1000)                        Time Spent with Patient: 37 (01/09/17 1000)   Met patient prior to post-op discharge from hospital.  Discussed availability, and need for caregiver assistance at home.  States she has family assistance, and [place] upon discharge.  Discussed availability of Lymphedema Class for prevention, exercise. Will meet with patient at post-op visit with Dr. Mike Gip on 01/31/17.

## 2017-01-09 NOTE — Discharge Instructions (Addendum)
Empty each drain 2 times per day and as needed.  Record amounts of daily drainage for each drain.  Keep dressings dry.  Take Tylenol or Norco if needed for pain.  Should not drive or do anything dangerous when taking Norco.  Avoid aspirin until after office visit.  Gradually increase activities as discussed.  Call the office next week to report the amount of drainage and schedule a follow-up visit.

## 2017-01-09 NOTE — Anesthesia Postprocedure Evaluation (Signed)
Anesthesia Post Note  Patient: Sandra Potter  Procedure(s) Performed: SIMPLE MASTECTOMY (Bilateral ) SENTINEL NODE BIOPSY (Left ) REMOVAL OF SEBORRHEIC KERATOSES, RIGHT BREAST (Right )  Patient location during evaluation: PACU Anesthesia Type: General Level of consciousness: awake Pain management: pain level controlled Vital Signs Assessment: post-procedure vital signs reviewed and stable Respiratory status: spontaneous breathing Cardiovascular status: stable Anesthetic complications: no     Last Vitals:  Vitals:   01/08/17 2016 01/09/17 0517  BP: 123/60 (!) 113/57  Pulse: (!) 101 88  Resp: 20 20  Temp:  (!) 36.4 C  SpO2: 97% 100%    Last Pain:  Vitals:   01/09/17 0517  TempSrc: Oral  PainSc:                  [name],Sandra Potter

## 2017-01-11 LAB — SURGICAL PATHOLOGY

## 2017-01-31 ENCOUNTER — Encounter: Payer: Self-pay | Admitting: Hematology and Oncology

## 2017-01-31 ENCOUNTER — Other Ambulatory Visit: Payer: Self-pay

## 2017-01-31 ENCOUNTER — Inpatient Hospital Stay: Payer: Medicare Other | Attending: Hematology and Oncology | Admitting: Hematology and Oncology

## 2017-01-31 VITALS — BP 125/79 | HR 103 | Temp 97.5°F | Resp 20 | Ht 67.0 in | Wt 192.4 lb

## 2017-01-31 DIAGNOSIS — R7303 Prediabetes: Secondary | ICD-10-CM | POA: Insufficient documentation

## 2017-01-31 DIAGNOSIS — M810 Age-related osteoporosis without current pathological fracture: Secondary | ICD-10-CM | POA: Insufficient documentation

## 2017-01-31 DIAGNOSIS — F419 Anxiety disorder, unspecified: Secondary | ICD-10-CM | POA: Diagnosis not present

## 2017-01-31 DIAGNOSIS — D0512 Intraductal carcinoma in situ of left breast: Secondary | ICD-10-CM | POA: Insufficient documentation

## 2017-01-31 DIAGNOSIS — I10 Essential (primary) hypertension: Secondary | ICD-10-CM | POA: Insufficient documentation

## 2017-01-31 DIAGNOSIS — M8580 Other specified disorders of bone density and structure, unspecified site: Secondary | ICD-10-CM | POA: Diagnosis not present

## 2017-01-31 DIAGNOSIS — Z9013 Acquired absence of bilateral breasts and nipples: Secondary | ICD-10-CM | POA: Diagnosis not present

## 2017-01-31 DIAGNOSIS — Z79899 Other long term (current) drug therapy: Secondary | ICD-10-CM | POA: Diagnosis not present

## 2017-01-31 DIAGNOSIS — Z87891 Personal history of nicotine dependence: Secondary | ICD-10-CM | POA: Insufficient documentation

## 2017-01-31 DIAGNOSIS — M1990 Unspecified osteoarthritis, unspecified site: Secondary | ICD-10-CM | POA: Insufficient documentation

## 2017-01-31 DIAGNOSIS — D563 Thalassemia minor: Secondary | ICD-10-CM | POA: Insufficient documentation

## 2017-01-31 DIAGNOSIS — D509 Iron deficiency anemia, unspecified: Secondary | ICD-10-CM | POA: Diagnosis not present

## 2017-01-31 NOTE — Progress Notes (Signed)
[facility] day:  01/31/2017  Chief Complaint: Sandra Potter is a 71 y.o. 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  HPI:  The patient was last seen in the medical oncology clinic on 11/28/2016 for initial consultation.  At that time, we discussed management of DCIS.  She was scheduled for breast MRI.  Bilateral breast MRI on 11/13/20218 revealed 8 mm faint enhancement within the anterior upper-outer left breast with biopsy clip artifact compatible with biopsy-proven DCIS/post biopsy changes.  There were two separate areas of stippled nodular non masslike enhancement within the left breast - 2 x 1.8 x 2 cm in the anterior lower inner left breast and 5 x 8.5 x 4.5 cm in the upper/upper-outer left breast - suspicious for DCIS. Recommend MR guided biopsies of the lower inner left breast enhancement and posterior aspect of the upper/upper-outer left breast enhancement to determine disease extent, if patient desires breast conservation.  There was no MR evidence of abnormal lymph nodes or right breast malignancy.  She underwent bilateral mastectomy and left sentinel lymph node biopsy on 01/08/2017 by Dr. Rochel Brome.  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  There was no invasive carcinoma.  Calcifications were associated with DCIS and benign epithelium.  Sentinel lymph node was negative.  Pathologic stage was pTis (DCIS) pN0(sn).  Right breast revealed atypical ductal hyperplasia.  Symptomatically, she is doing well.  Drain was removed.   Past Medical History:  Diagnosis Date  . Allergy   . Anxiety    mild  . Arthritis   . Breast cancer ([facility])    new diagnosis  . Cataract   . Complication of anesthesia    throat sore after being intubated, suggested to use smaller airway to prevent pain afterwards  . Glaucoma   . Hypertension   . Osteoporosis   . Pre-diabetes   . Skin cancer   .  Thalassemia minor   . Thalassemia minor     Past Surgical History:  Procedure Laterality Date  . ADENOIDECTOMY  1956  . APPENDECTOMY    . basal cell removal  1995  . BREAST BIOPSY Right 1990s   neg. result.   Marland Kitchen CATARACT EXTRACTION Right 2001  . CATARACT EXTRACTION Left 2011  . COLONOSCOPY  2005  . COLONOSCOPY  2015  . MOLE REMOVAL Right 01/08/2017   Procedure: REMOVAL OF SEBORRHEIC KERATOSES, RIGHT BREAST;  Surgeon: Leonie Green, MD;  Location: [facility];  Service: General;  Laterality: Right;  . PAROTID GLAND TUMOR EXCISION  2005  . RADIOLOGY WITH ANESTHESIA N/A 12/18/2016   Procedure: BREAST MRI WITH AND WITHOUT CONTRAST;  Surgeon: Radiologist, Medication, MD;  Location: [facility];  Service: Radiology;  Laterality: N/A;  . SENTINEL NODE BIOPSY Left 01/08/2017   Procedure: SENTINEL NODE BIOPSY;  Surgeon: Leonie Green, MD;  Location: [facility];  Service: General;  Laterality: Left;  . SIMPLE MASTECTOMY WITH AXILLARY SENTINEL NODE BIOPSY Bilateral 01/08/2017   Procedure: SIMPLE MASTECTOMY;  Surgeon: Leonie Green, MD;  Location: [facility];  Service: General;  Laterality: Bilateral;    Family History  Problem Relation Age of Onset  . Breast cancer Mother   . Lymphoma Mother   . Kidney cancer Father   . Endometrial cancer Sister   . Endometrial cancer Maternal Grandmother     Social History:  reports that she quit smoking about 19 years ago. She has a 33.00  pack-year smoking history. she has never used smokeless tobacco. She reports that she drinks about 3.0 - 4.2 oz of alcohol per week. She reports that she does not use drugs. 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  She likes to hike.  She previously smoked 1 pack/day x 33 years.  She stopped smoking in 08/99.  She drinks 5-7 glasses wine/week.  The patient is accompanied by her sister, Sandra Potter, and daughter, Sandra Potter, today.  Allergies:  Allergies   Allergen Reactions  . Dust Mite Extract Other (See Comments) and Cough    Also watery eyes and sinus congestion  . Mold Extract [Trichophyton] Other (See Comments) and Cough    Also sinus congestion and watery eyes.  . Statins Other (See Comments)    Muscle pain  . Ativan [Lorazepam] Anxiety and Other (See Comments)    Panic attacks  . E-Mycin [Erythromycin] Rash  . Prozac [Fluoxetine Hcl] Anxiety  . Valium [Diazepam] Anxiety and Other (See Comments)    Panic attacks  . Xanax [Alprazolam] Anxiety    Current Medications: Current Outpatient Medications  Medication Sig Dispense Refill  . butabarbital (BUTISOL SODIUM) 30 MG tablet Take 30 mg 2 (two) times daily as needed by mouth (for anxiety).     . Calcium Carbonate-Vit D-Min (CALTRATE 600+D PLUS PO) Take 1 tablet 2 (two) times daily by mouth.     . cyclobenzaprine (FLEXERIL) 5 MG tablet Take 5 mg 3 (three) times daily as needed by mouth for muscle spasms.     . diphenhydrAMINE (BENADRYL) 25 mg capsule Take 25 mg daily as needed by mouth for allergies.     Marland Kitchen ibuprofen (ADVIL,MOTRIN) 200 MG tablet Take 400 mg every 6 (six) hours as needed by mouth for headache or moderate pain.     . Misc Natural Products (GLUCOSAMINE CHOND COMPLEX/MSM) TABS Take 2 tablets 2 (two) times daily by mouth. Joint Advantage    . Multiple Vitamins-Minerals (CENTRUM SILVER ULTRA WOMENS) TABS Take 1 tablet by mouth daily.    Marland Kitchen oxymetazoline (AFRIN) 0.05 % nasal spray Place 2 sprays 2 (two) times daily as needed into the nose for congestion (mostly at bedtime).     . sodium chloride (OCEAN) 0.65 % SOLN nasal spray Place 2 sprays as needed into both nostrils for congestion.     . TH COD LIVER OIL CAPS Take 1 tablet by mouth daily.    . timolol (TIMOPTIC) 0.5 % ophthalmic solution Place 1 drop into both eyes daily.    Marland Kitchen triamterene-hydrochlorothiazide (DYAZIDE) 37.5-25 MG capsule Take 1 capsule by mouth daily.    . vitamin E 400 UNIT capsule Take 400 Units by mouth  daily.     No current facility-administered medications for this visit.     Review of Systems:  GENERAL:  Feels good.  Slight decrease in energy associated with thalassemia.  No fevers or sweats.  Weight down 1 pound. PERFORMANCE STATUS (ECOG):  0 HEENT:  Allergies.  No visual changes, runny nose, sore throat, mouth sores or tenderness. Lungs: No shortness of breath or cough.  No hemoptysis. Cardiac:  No chest pain, palpitations, orthopnea, or PND. GI:  No nausea, vomiting, diarrhea, constipation, melena or hematochezia.  Colonoscopy 06/23/2013. GU:  No urgency, frequency, dysuria, or hematuria. Musculoskeletal:  No back pain.  Arthritis.  No muscle tenderness. Extremities:  No pain or swelling. Skin:  Multiple moles.  No rashes or skin changes. Drain out. Neuro:  No  headache, numbness or weakness, balance or coordination issues. Endocrine:  No diabetes, thyroid issues, hot flashes or night sweats. Psych:  No mood changes, depression or anxiety. Pain:  No focal pain. Review of systems:  All other systems reviewed and found to be negative.  Physical Exam: Blood pressure 125/79, pulse (!) 103, temperature (!) 97.5 F (36.4 C), temperature source Tympanic, resp. rate 20, height 5\' 7"  (1.702 m), weight 192 lb 6.4 oz (87.3 kg). GENERAL:  Well developed, well nourished, woman sitting comfortably in the exam room in no acute distress. MENTAL STATUS:  Alert and oriented to person, place and time. HEAD:  Styled shoulder length gray hair.  Normocephalic, atraumatic, face symmetric, no Cushingoid features. EYES:  Glasses.  Blue  eyes.  Pupils equal round and reactive to light and accomodation.  No conjunctivitis or scleral icterus. ENT:  Oropharynx clear without lesion.  Tongue normal. Mucous membranes moist.  RESPIRATORY:  Clear to auscultation without rales, wheezes or rhonchi. CARDIOVASCULAR:  Regular rate and rhythm without murmur, rub or gallop. BREAST:  s/p bilateral mastectomy.  Surgical  glue overlying healing incisions.  Small bandage on left s/p drain removal. ABDOMEN:  Soft, non-tender, with active bowel sounds, and no hepatosplenomegaly.  No masses. SKIN:  Multiple moles.  No rashes, ulcers or lesions. EXTREMITIES: No edema, no skin discoloration or tenderness.  No palpable cords. LYMPH NODES: No palpable cervical, supraclavicular, axillary or inguinal adenopathy  NEUROLOGICAL: Unremarkable. PSYCH:  Appropriate.   No visits with results within 3 Day(s) from this visit.  Latest known visit with results is:  Admission on 01/08/2017, Discharged on 01/09/2017  Component Date Value Ref Range Status  . Sodium 01/08/2017 133* 135 - 145 mmol/L Final  . Potassium 01/08/2017 4.4  3.5 - 5.1 mmol/L Final  . Glucose, Bld 01/08/2017 120* 65 - 99 mg/dL Final  . HCT 01/08/2017 40.0  36.0 - 46.0 % Final  . Hemoglobin 01/08/2017 13.6  12.0 - 15.0 g/dL Final  . Glucose-Capillary 01/08/2017 120* 65 - 99 mg/dL Final  . SURGICAL PATHOLOGY 01/08/2017    Final-Edited                   Value:Surgical Pathology THIS IS AN ADDENDUM REPORT CASE: [id number] PATIENT: Ceilidh Clack Surgical Pathology Report Addendum  Reason for Addendum #1:  Immunohistochemistry results  SPECIMEN SUBMITTED: A. Sentinel lymph node, left axilla B. Breast,, left C. Breast, right  CLINICAL HISTORY: None provided  PRE-OPERATIVE DIAGNOSIS: Ductal carcinoma  POST-OPERATIVE DIAGNOSIS: Ductal carcinoma     DIAGNOSIS: A. SENTINEL LYMPH NODE, LEFT AXILLA; EXCISION: - ONE LYMPH NODE NEGATIVE FOR MALIGNANCY (0/1).  B. BREAST, LEFT; SIMPLE MASTECTOMY: - HIGH GRADE DUCTAL CARCINOMA IN SITU, COMEDO TYPE. - NEGATIVE FOR INVASIVE CARCINOMA. - SEE CANCER SUMMARY BELOW. - BIOPSY SITE CHANGE. - SCLEROSING ADENOSIS. - CYSTIC APOCRINE METAPLASIA. - CALCIFICATIONS ASSOCIATED WITH DCIS AND BENIGN MAMMARY EPITHELIUM. - UNREMARKABLE NIPPLE.  C. BREAST, RIGHT; SIMPLE MASTECTOMY: - ATYPICAL DUCTAL  HYPERPLASIA. - SCLEROSING ADENOSIS. - CYSTIC APOCR                         INE METAPLASIA. - CALCIFICATIONS ASSOCIATED WITH BENIGN MAMMARY EPITHELIUM. - SKIN WITH SEBORRHIC KERATOSIS. - UNREMARKABLE NIPPLE.  Surgical Pathology Cancer Case Summary  DUCTAL CARCINOMA IN SITU OF THE BREAST: Procedure: Simple mastectomy Specimen Laterality: Left Size (Extent) of DCIS:  at least 25 mm Histologic Type: Ductal carcinoma in situ (DCIS) Nuclear Grade: 3 Necrosis: Present, Central (expansive comedo necrosis) Margins:  Negative for DCIS      Distance from closest margin: 81 mm from deep margin Regional lymph nodes: Negative for metastasis      Number of lymph nodes examined: 1      Number of sentinel nodes examined: 1 Pathologic Stage Classification (pTNM, AJCC 8th Edition): pTis (DCIS) pN0 (sn)  Comment: ER and PR will be performed and resulted in an addendum.  GROSS DESCRIPTION:  A. Intraoperative Consultation:     Received: fresh     Specimen: Sentinel lymph node, left axilla (stitch marking Sentinel lymph node)     Pathologic Evaluation:                          frozen section     Diagnosis: FS A1 A2 : One lymph node. Negative for macro metastasis.     Communicated to: Dr. Tamala Julian at 12:12 PM on 01/08/2017, Quay Burow M.D.     Tissue submitted: 1-2  A. Labeled: Sentinel lymph node left axilla (stitch marking Sentinel lymph node)  Tissue fragment(s): 1  Size: 3.4 x 1.1 x 0.6 cm  Description: lymph node candidate with a stitch and of attached fibrofatty tissue, lymph node bisected and entirely frozen, following frozen section formalin fixed  Block summary 1-2-frozen section remnants  B. Labeled: left breast (stitch at axillary end of skin ellipse)  Time in fixative: 12:29 PM  Cold ischemic time: 9 minutes  Total fixation time: 9.75 hours  Type of mastectomy: simple  Laterality: left  Weight of specimen: 2092 grams  Size of specimen: 28.2 x 19.7 x 6.8  cm  Orientation of specimen: lateral-orange Deep-black Remaining-blue  Skin ellipse dimensions  description: 28.1x 20.5 cm ellipse of tan skin  Nipple/ are                         ola: nipple-1.1 cm in diameter and areola 7.5 x 3.7 cm  Axillary tail: no appreciable  Biopsy site(s):  Number of discrete masses: no discrete mass, metallic clip (coil) identified in area of hemorrhage and fibrosis at approximately 1:30  Location of mass(es): 1:30  Size of mass(es)/biopsy site(s): no discernible mass, 2.5 x 2.5 x 2.0 cm area of dense fibrous tissue and hemorrhage surrounding metallic clip  Description of mass(es)/biopsy site(s): metallic clip with hemorrhage and fibrous tissue  Margins: 8.1 cm from deep  Gross involvement of skin/fascia/muscle by tumor: no identified  Description of remaining breast: yellow lobulated fibrofatty with a central ill-defined area of fibrous tissue and lumens filled with dark brown material, 7.2 cm inferior to area with metallic clip and 7 cm from deep  Lymph nodes: not identified  Block Summary: 1-perpendicular deep 2-9-representative area around biopsy clip (cassette 3 containing metallic clip) [id number]                         sentative area inferior to biopsy clip area dilated lumen containing dark brown material 13-representative upper outer quadrant 14-representative lower outer quadrant 15-representative upper inner quadrant 16-representative lower inner quadrant 17-representative nipple and areola   C. Labeled: right breast (stitch at the axillary end of the skin ellipse)  Time in fixative: 214 p.m.  Cold ischemic time: 14 minutes  Total fixation time: 8 hours  Type of mastectomy: simple  Laterality: right  Weight of specimen:  2048 grams  Size of specimen: 26.5 x 19.5 x 2.3 cm  Orientation of specimen: stitch lateral  Lateral-yellow Deep-black Remaining-blue  Skin ellipse dimensions  description: 25.5 x 19.2 cm  ellipse of tan skin, there are also multiple slightly raised granular tan to brown skin lesion on the medial to inferior aspect ranging from 0.3-1.5 cm in greatest dimension  Nipple/ areola: 1.2 cm nipple and 7.0 x 4.5 cm areola  Axillary tail: no appreciable                           Biopsy site(s): none identified  Description of remaining breast: yellow lobulated fibrous and fatty with focal cysts  Lymph nodes: no identified  Block Summary: 1-5-representative tissue in upper inner quadrant 6-7-representative lower inner quadrant 8-9-representative upper outer quadrant 10-11-representative lower inner quadrant 12-representative nipple and areola and representative skin lesions  Final Diagnosis performed by Quay Burow, MD.  Electronically signed 01/10/2017 8:48:02AM   The electronic signature indicates that the named Attending Pathologist has evaluated the specimen  Technical component performed at [facility], [address] [place], [place] [address] Lab: [phone] Dir: Rush Farmer, MD, MMM  Professional component performed at [facility], [facility], [place], [place], [place] [address] Lab: [phone] Dir: Dellia Nims. Reuel Derby, MD   Breast Biomarker Reporting Template: ER and PR  BREAST BIOMARKER TESTS                          Estrogen Receptor (ER) Status: Negative Internal controls worked appropriately Progesterone Receptor (PgR) Status: Negative Internal controls worked appropriately.  METHODS Cold Ischemia and Fixation Times: Meet requirements specified in latest version of the ASCO/CAP guidelines Testing Performed on Block Number(s): B7 Fixative: Formalin Estrogen Receptor:  FDA cleared ([place]); Primary Antibody:  SP1 Progesterone Receptor: FDA cleared ([place]); Primary Antibody: 1E2  Immunohistochemistry controls worked appropriately. Slides were prepared by [facility] for Molecular Biology and Pathology, RTP, [place],  and interpreted by Quay Burow, MD.      Addendum #1 performed by Quay Burow, MD.  Electronically signed 01/11/2017 2:47:04PM    Technical component performed at [facility], [address], [place], [place] [address] Lab: [phone] Dir: Rush Farmer, MD, MMM  Professional component performed at [facility], [facility], [place], [place],  [address] Lab: [phone] Dir: Dellia Nims. Reuel Derby, MD    . Glucose-Capillary 01/08/2017 150* 65 - 99 mg/dL Final    Assessment:  Sandra Potter is a 71 y.o. female with left breast DCIS s/p bilateral mastectomy on 01/08/2017.  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  There was no invasive carcinoma.  Calcifications were associated with DCIS and benign epithelium.  Sentinel lymph node was negative.  Pathologic stage was pTis (DCIS) pN0(sn).  Right breast revealed atypical ductal hyperplasia.  She underwent ultrasound guided biopsy on 11/21/2016.  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   Bilateral screening mammogram on 10/24/2016 revealed a possible mass with distortion in the left breast.  Diagnostic mammogram and ultrasound on 11/08/2016 revealed a 7 x 5 x 3 mm mildly irregular, hypoechoic mass in the 1:30 o'clock position of the left breast. There were areas of questionable distortion in the upper outer left breast and small questionable mass like area in the 3:00 position of the left breast posteriorly. There was no sonographic correlate.    Breast MRI  on 11/13/20218 revealed 8 mm faint enhancement within the anterior upper-outer left breast with biopsy clip artifact compatible with biopsy-proven DCIS/post biopsy changes.  There were two separate areas of stippled nodular non masslike enhancement within the left breast - 2 x 1.8 x 2 cm in the anterior lower inner left breast and 5 x 8.5 x 4.5 cm in the upper/upper-outer left breast -  suspicious for DCIS. There was no MR evidence of abnormal lymph nodes or right breast malignancy.  Bone density study on 11/01/2015 revealed osteopenia with a T-score of -2.3 in the left femoral neck, -2.1 in the left hip, and -0.7 in the AP spine L1-L4.  She has thalassemia minor.  She has a mild microcytic anemia.  Symptomatically, she feels good.  Exam reveals post-operative changes.  Plan: 1.  Discuss pathology results from bilateral mastectomy.  DCIS in left breast.  As patient s/p bilateral mastectomy, no indication for hormonal therapy.  Discuss follow-up every 6 months then once a year.  Multiple questions were asked and answered.  2.  RTC in 6 months for MD assessment and labs (CBC with diff, CMP).   Lequita Asal, MD  01/31/2017, 4:35 PM

## 2017-02-01 ENCOUNTER — Other Ambulatory Visit: Payer: Self-pay | Admitting: *Deleted

## 2017-02-01 DIAGNOSIS — D0512 Intraductal carcinoma in situ of left breast: Secondary | ICD-10-CM

## 2017-02-05 ENCOUNTER — Encounter: Payer: Self-pay | Admitting: Hematology and Oncology

## 2017-03-05 NOTE — Progress Notes (Signed)
  Oncology Nurse Navigator Documentation  Navigator Location: [facility] (03/05/17 1100)   )Navigator Encounter Type: Telephone (03/05/17 1100) Telephone: Outgoing Call;Symptom Mgt;Education (03/05/17 1100)                   Patient Visit Type: Follow-up;Other (03/05/17 1100)                  Specialty Items/DME: Prosthesis(bras) (03/05/17 1100)           Time Spent with Patient: 30 (03/05/17 1100)   Patient called with questions about bras.  Judeen Hammans from Dr.Smith's office to fax prescription to [facility].  Patient denies need for assistance from [employer] at this time.  She is to call back if wants referral to physical therapy.

## 2017-04-25 NOTE — Progress Notes (Signed)
  Oncology Nurse Navigator Documentation  Navigator Location: [facility] (04/25/17 1600)   )                      Patient Visit Type: Follow-up (04/25/17 1600)   Barriers/Navigation Needs: Financial (04/25/17 1600)   Interventions: Referrals(Cancer Policy) (66/44/03 4742) Referrals: Lymphdema(Lymphedema class) (04/25/17 1600)        Specialty Items/DME: Prosthesis (04/25/17 1600)           Time Spent with Patient: 30 (04/25/17 1600)   Patient  has been caring for her sister who as in a bad car accident recently.  Concerned about Medical bills.  Requests assistance with Cancer Policy.  To drop off forms to be given to Lula Olszewski to assist with filling out.  Information given on lymphedema class for exercises, and info  On obtaining prostheses.

## 2017-07-30 ENCOUNTER — Other Ambulatory Visit: Payer: Self-pay

## 2017-07-30 ENCOUNTER — Inpatient Hospital Stay (HOSPITAL_BASED_OUTPATIENT_CLINIC_OR_DEPARTMENT_OTHER): Payer: Medicare Other | Admitting: Hematology and Oncology

## 2017-07-30 ENCOUNTER — Inpatient Hospital Stay: Payer: Medicare Other | Attending: Hematology and Oncology

## 2017-07-30 ENCOUNTER — Encounter: Payer: Self-pay | Admitting: Hematology and Oncology

## 2017-07-30 VITALS — BP 145/79 | HR 96 | Temp 97.4°F | Wt 183.6 lb

## 2017-07-30 DIAGNOSIS — D509 Iron deficiency anemia, unspecified: Secondary | ICD-10-CM

## 2017-07-30 DIAGNOSIS — D229 Melanocytic nevi, unspecified: Secondary | ICD-10-CM

## 2017-07-30 DIAGNOSIS — D563 Thalassemia minor: Secondary | ICD-10-CM | POA: Diagnosis not present

## 2017-07-30 DIAGNOSIS — D0512 Intraductal carcinoma in situ of left breast: Secondary | ICD-10-CM | POA: Insufficient documentation

## 2017-07-30 DIAGNOSIS — M8580 Other specified disorders of bone density and structure, unspecified site: Secondary | ICD-10-CM | POA: Diagnosis not present

## 2017-07-30 LAB — COMPREHENSIVE METABOLIC PANEL
ALT: 17 U/L (ref 0–44)
AST: 20 U/L (ref 15–41)
Albumin: 4.1 g/dL (ref 3.5–5.0)
Alkaline Phosphatase: 55 U/L (ref 38–126)
Anion gap: 7 (ref 5–15)
BUN: 14 mg/dL (ref 8–23)
CO2: 29 mmol/L (ref 22–32)
Calcium: 9.3 mg/dL (ref 8.9–10.3)
Chloride: 95 mmol/L — ABNORMAL LOW (ref 98–111)
Creatinine, Ser: 0.54 mg/dL (ref 0.44–1.00)
GFR calc Af Amer: >60 mL/min (ref >60)
GFR calc non Af Amer: >60 mL/min (ref >60)
Glucose, Bld: 123 mg/dL — ABNORMAL HIGH (ref 70–99)
Potassium: 4 mmol/L (ref 3.5–5.1)
Sodium: 131 mmol/L — ABNORMAL LOW (ref 135–145)
Total Bilirubin: 0.7 mg/dL (ref 0.3–1.2)
Total Protein: 7.1 g/dL (ref 6.5–8.1)

## 2017-07-30 LAB — CBC WITH DIFFERENTIAL/PLATELET
Basophils Absolute: 0.1 10*3/uL (ref 0–0.1)
Basophils Relative: 1 %
Eosinophils Absolute: 0.3 10*3/uL (ref 0–0.7)
Eosinophils Relative: 5 %
HCT: 36 % (ref 35.0–47.0)
Hemoglobin: 11.8 g/dL — ABNORMAL LOW (ref 12.0–16.0)
Lymphocytes Relative: 25 %
Lymphs Abs: 1.7 10*3/uL (ref 1.0–3.6)
MCH: 19.6 pg — ABNORMAL LOW (ref 26.0–34.0)
MCHC: 32.8 g/dL (ref 32.0–36.0)
MCV: 59.7 fL — ABNORMAL LOW (ref 80.0–100.0)
Monocytes Absolute: 0.8 10*3/uL (ref 0.2–0.9)
Monocytes Relative: 11 %
Neutro Abs: 3.8 10*3/uL (ref 1.4–6.5)
Neutrophils Relative %: 58 %
Platelets: 290 10*3/uL (ref 150–440)
RBC: 6.03 MIL/uL — ABNORMAL HIGH (ref 3.80–5.20)
RDW: 17.3 % — ABNORMAL HIGH (ref 11.5–14.5)
WBC: 6.6 10*3/uL (ref 3.6–11.0)

## 2017-07-30 NOTE — Progress Notes (Signed)
[facility] day:  07/30/2017   Chief Complaint: Sandra Potter is a 72 y.o. female with left breast DCIS s/p bilateral mastectomy who is seen for 6 month assessment.  HPI:  The patient was last seen in the medical oncology clinic on 01/31/2017 after bilateral mastectomy.   At that time, she felt good.  Exam revealed post-operative changes.  During the interim, patient is doing "ok, but not great". Patient describes a "band like" sensation of compression in her chest following her surgery. Patient with an area of "firmness" in her abdomen. She states, "I used to have large breasts. Since surgery, I can see my abdomen a little better. When I look in the mirror, I seen this lump".  Patient denies that she has experienced any B symptoms. She denies any interval infections.   Patient advises that she maintains an adequate appetite. She is eating well. Weight today is 183 lb 9.6 oz (83.3 kg), which compared to her last visit to the clinic, represents a  9 pound weight loss. Unsure if this is a true weight loss. Patient questions previous scale error.   Patient denies pain in the clinic today.   Past Medical History:  Diagnosis Date  . Allergy   . Anxiety    mild  . Arthritis   . Breast cancer ([place])    new diagnosis  . Cataract   . Complication of anesthesia    throat sore after being intubated, suggested to use smaller airway to prevent pain afterwards  . Glaucoma   . Hypertension   . Osteoporosis   . Pre-diabetes   . Skin cancer   . Thalassemia minor   . Thalassemia minor     Past Surgical History:  Procedure Laterality Date  . ADENOIDECTOMY  1956  . APPENDECTOMY    . basal cell removal  1995  . BREAST BIOPSY Right 1990s   neg. result.   Marland Kitchen CATARACT EXTRACTION Right 2001  . CATARACT EXTRACTION Left 2011  . COLONOSCOPY  2005  . COLONOSCOPY  2015  . MOLE REMOVAL Right 01/08/2017   Procedure: REMOVAL OF SEBORRHEIC KERATOSES, RIGHT  BREAST;  Surgeon: Leonie Green, MD;  Location: [facility];  Service: General;  Laterality: Right;  . PAROTID GLAND TUMOR EXCISION  2005  . RADIOLOGY WITH ANESTHESIA N/A 12/18/2016   Procedure: BREAST MRI WITH AND WITHOUT CONTRAST;  Surgeon: Radiologist, Medication, MD;  Location: [facility];  Service: Radiology;  Laterality: N/A;  . SENTINEL NODE BIOPSY Left 01/08/2017   Procedure: SENTINEL NODE BIOPSY;  Surgeon: Leonie Green, MD;  Location: [facility];  Service: General;  Laterality: Left;  . SIMPLE MASTECTOMY WITH AXILLARY SENTINEL NODE BIOPSY Bilateral 01/08/2017   Procedure: SIMPLE MASTECTOMY;  Surgeon: Leonie Green, MD;  Location: [facility];  Service: General;  Laterality: Bilateral;    Family History  Problem Relation Age of Onset  . Breast cancer Mother   . Lymphoma Mother   . Kidney cancer Father   . Endometrial cancer Sister   . Endometrial cancer Maternal Grandmother     Social History:  reports that she quit smoking about 19 years ago. She has a 33.00 pack-year smoking history. She has never used smokeless tobacco. She reports that she drinks about 3.0 - 4.2 oz of alcohol per week. She reports that she does not use drugs. 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  She likes  to hike.  She previously smoked 1 pack/day x 33 years.  She stopped smoking in 08/99.  She drinks 5-7 glasses wine/week.  Her sister's name is Lelon Frohlich and daughter's name is Pamala Hurry.  She is alone today.  Allergies:  Allergies  Allergen Reactions  . Dust Mite Extract Other (See Comments) and Cough    Also watery eyes and sinus congestion  . Mold Extract [Trichophyton] Other (See Comments) and Cough    Also sinus congestion and watery eyes.  . Statins Other (See Comments)    Muscle pain  . Ativan [Lorazepam] Anxiety and Other (See Comments)    Panic attacks  . E-Mycin [Erythromycin] Rash  . Prozac [Fluoxetine Hcl] Anxiety  . Valium  [Diazepam] Anxiety and Other (See Comments)    Panic attacks  . Xanax [Alprazolam] Anxiety    Current Medications: Current Outpatient Medications  Medication Sig Dispense Refill  . aspirin EC 81 MG tablet Take 81 mg by mouth daily.    . butabarbital (BUTISOL SODIUM) 30 MG tablet Take 30 mg 2 (two) times daily as needed by mouth (for anxiety).     . Calcium Carbonate-Vit D-Min (CALTRATE 600+D PLUS PO) Take 1 tablet 2 (two) times daily by mouth.     . cyclobenzaprine (FLEXERIL) 5 MG tablet Take 5 mg 3 (three) times daily as needed by mouth for muscle spasms.     . diphenhydrAMINE (BENADRYL) 25 mg capsule Take 25 mg daily as needed by mouth for allergies.     Marland Kitchen ibuprofen (ADVIL,MOTRIN) 200 MG tablet Take 400 mg every 6 (six) hours as needed by mouth for headache or moderate pain.     . Misc Natural Products (GLUCOSAMINE CHOND COMPLEX/MSM) TABS Take 2 tablets 2 (two) times daily by mouth. Joint Advantage    . Multiple Vitamins-Minerals (CENTRUM SILVER ULTRA WOMENS) TABS Take 1 tablet by mouth daily.    Marland Kitchen oxymetazoline (AFRIN) 0.05 % nasal spray Place 2 sprays 2 (two) times daily as needed into the nose for congestion (mostly at bedtime).     . sodium chloride (OCEAN) 0.65 % SOLN nasal spray Place 2 sprays as needed into both nostrils for congestion.     . TH COD LIVER OIL CAPS Take 1 tablet by mouth daily.    . timolol (TIMOPTIC) 0.5 % ophthalmic solution Place 1 drop into both eyes daily.    Marland Kitchen triamterene-hydrochlorothiazide (DYAZIDE) 37.5-25 MG capsule Take 1 capsule by mouth daily.    . vitamin E 400 UNIT capsule Take 400 Units by mouth daily.     No current facility-administered medications for this visit.     Review of Systems  Constitutional: Positive for weight loss (Questionable scale error; 9 pound loss). Negative for diaphoresis, fever and malaise/fatigue.  HENT: Negative.   Eyes: Negative.   Respiratory: Negative for cough, hemoptysis, sputum production and shortness of breath.    Cardiovascular: Negative for chest pain, palpitations, orthopnea, leg swelling and PND.  Gastrointestinal: Negative for abdominal pain, blood in stool, constipation, diarrhea, melena, nausea and vomiting.  Genitourinary: Negative for dysuria, frequency, hematuria and urgency.  Musculoskeletal: Negative for back pain, falls, joint pain and myalgias.  Skin: Negative for itching and rash.       Multiple dark and irregular nevi to anterior torso.   Neurological: Negative for dizziness, tremors, weakness and headaches.  Endo/Heme/Allergies: Does not bruise/bleed easily.  Psychiatric/Behavioral: Negative for depression, memory loss and suicidal ideas. The patient is not nervous/anxious and does not have insomnia.   All other systems  reviewed and are negative.  Performance status (ECOG): 0 - Asymptomatic  Physical Exam: Blood pressure (!) 145/79, pulse 96, temperature (!) 97.4 F (36.3 C), temperature source Tympanic, weight 183 lb 9.6 oz (83.3 kg). GENERAL:  Well developed, well nourished, woman sitting comfortably in the exam room in no acute distress. MENTAL STATUS:  Alert and oriented to person, place and time. HEAD: Styled short gray hair.  Normocephalic, atraumatic, face symmetric, no Cushingoid features. EYES:  Glasses.  Blue eyes.  Pupils equal round and reactive to light and accomodation.  No conjunctivitis or scleral icterus. ENT:  Oropharynx clear without lesion.  Tongue normal. Mucous membranes moist.  RESPIRATORY:  Clear to auscultation without rales, wheezes or rhonchi. CARDIOVASCULAR:  Regular rate and rhythm without murmur, rub or gallop. BREAST:  s/p bilateral mastectomy.  No skin changes. ABDOMEN:  Soft, non-tender, with active bowel sounds, and no hepatosplenomegaly.  No masses. SKIN:  Multiple moles.  No rashes, ulcers or lesions. EXTREMITIES: No edema, no skin discoloration or tenderness.  No palpable cords. LYMPH NODES: No palpable cervical, supraclavicular, axillary or  inguinal adenopathy  NEUROLOGICAL: Unremarkable. PSYCH:  Appropriate.     No visits with results within 3 Day(s) from this visit.  Latest known visit with results is:  Admission on 01/08/2017, Discharged on 01/09/2017  Component Date Value Ref Range Status  . Sodium 01/08/2017 133* 135 - 145 mmol/L Final  . Potassium 01/08/2017 4.4  3.5 - 5.1 mmol/L Final  . Glucose, Bld 01/08/2017 120* 65 - 99 mg/dL Final  . HCT 01/08/2017 40.0  36.0 - 46.0 % Final  . Hemoglobin 01/08/2017 13.6  12.0 - 15.0 g/dL Final  . Glucose-Capillary 01/08/2017 120* 65 - 99 mg/dL Final  . SURGICAL PATHOLOGY 01/08/2017    Final-Edited                   Value:Surgical Pathology THIS IS AN ADDENDUM REPORT CASE: [id number] PATIENT: Sandra Potter Surgical Pathology Report Addendum  Reason for Addendum #1:  Immunohistochemistry results  SPECIMEN SUBMITTED: A. Sentinel lymph node, left axilla B. Breast,, left C. Breast, right  CLINICAL HISTORY: None provided  PRE-OPERATIVE DIAGNOSIS: Ductal carcinoma  POST-OPERATIVE DIAGNOSIS: Ductal carcinoma     DIAGNOSIS: A. SENTINEL LYMPH NODE, LEFT AXILLA; EXCISION: - ONE LYMPH NODE NEGATIVE FOR MALIGNANCY (0/1).  B. BREAST, LEFT; SIMPLE MASTECTOMY: - HIGH GRADE DUCTAL CARCINOMA IN SITU, COMEDO TYPE. - NEGATIVE FOR INVASIVE CARCINOMA. - SEE CANCER SUMMARY BELOW. - BIOPSY SITE CHANGE. - SCLEROSING ADENOSIS. - CYSTIC APOCRINE METAPLASIA. - CALCIFICATIONS ASSOCIATED WITH DCIS AND BENIGN MAMMARY EPITHELIUM. - UNREMARKABLE NIPPLE.  C. BREAST, RIGHT; SIMPLE MASTECTOMY: - ATYPICAL DUCTAL HYPERPLASIA. - SCLEROSING ADENOSIS. - CYSTIC APOCR                         INE METAPLASIA. - CALCIFICATIONS ASSOCIATED WITH BENIGN MAMMARY EPITHELIUM. - SKIN WITH SEBORRHIC KERATOSIS. - UNREMARKABLE NIPPLE.  Surgical Pathology Cancer Case Summary  DUCTAL CARCINOMA IN SITU OF THE BREAST: Procedure: Simple mastectomy Specimen Laterality: Left Size (Extent) of DCIS:   at least 25 mm Histologic Type: Ductal carcinoma in situ (DCIS) Nuclear Grade: 3 Necrosis: Present, Central (expansive comedo necrosis) Margins: Negative for DCIS      Distance from closest margin: 81 mm from deep margin Regional lymph nodes: Negative for metastasis      Number of lymph nodes examined: 1      Number of sentinel nodes examined: 1 Pathologic Stage Classification (pTNM, AJCC 8th Edition): pTis (  DCIS) pN0 (sn)  Comment: ER and PR will be performed and resulted in an addendum.  GROSS DESCRIPTION:  A. Intraoperative Consultation:     Received: fresh     Specimen: Sentinel lymph node, left axilla (stitch marking Sentinel lymph node)     Pathologic Evaluation:                          frozen section     Diagnosis: FS A1 A2 : One lymph node. Negative for macro metastasis.     Communicated to: Dr. Tamala Julian at 12:12 PM on 01/08/2017, Quay Burow M.D.     Tissue submitted: 1-2  A. Labeled: Sentinel lymph node left axilla (stitch marking Sentinel lymph node)  Tissue fragment(s): 1  Size: 3.4 x 1.1 x 0.6 cm  Description: lymph node candidate with a stitch and of attached fibrofatty tissue, lymph node bisected and entirely frozen, following frozen section formalin fixed  Block summary 1-2-frozen section remnants  B. Labeled: left breast (stitch at axillary end of skin ellipse)  Time in fixative: 12:29 PM  Cold ischemic time: 9 minutes  Total fixation time: 9.75 hours  Type of mastectomy: simple  Laterality: left  Weight of specimen: 2092 grams  Size of specimen: 28.2 x 19.7 x 6.8 cm  Orientation of specimen: lateral-orange Deep-black Remaining-blue  Skin ellipse dimensions  description: 28.1x 20.5 cm ellipse of tan skin  Nipple/ are                         ola: nipple-1.1 cm in diameter and areola 7.5 x 3.7 cm  Axillary tail: no appreciable  Biopsy site(s):  Number of discrete masses: no discrete mass, metallic clip (coil) identified in area of  hemorrhage and fibrosis at approximately 1:30  Location of mass(es): 1:30  Size of mass(es)/biopsy site(s): no discernible mass, 2.5 x 2.5 x 2.0 cm area of dense fibrous tissue and hemorrhage surrounding metallic clip  Description of mass(es)/biopsy site(s): metallic clip with hemorrhage and fibrous tissue  Margins: 8.1 cm from deep  Gross involvement of skin/fascia/muscle by tumor: no identified  Description of remaining breast: yellow lobulated fibrofatty with a central ill-defined area of fibrous tissue and lumens filled with dark brown material, 7.2 cm inferior to area with metallic clip and 7 cm from deep  Lymph nodes: not identified  Block Summary: 1-perpendicular deep 2-9-representative area around biopsy clip (cassette 3 containing metallic clip) [id number]                         sentative area inferior to biopsy clip area dilated lumen containing dark brown material 13-representative upper outer quadrant 14-representative lower outer quadrant 15-representative upper inner quadrant 16-representative lower inner quadrant 17-representative nipple and areola   C. Labeled: right breast (stitch at the axillary end of the skin ellipse)  Time in fixative: 214 p.m.  Cold ischemic time: 14 minutes  Total fixation time: 8 hours  Type of mastectomy: simple  Laterality: right  Weight of specimen:  2048 grams  Size of specimen: 26.5 x 19.5 x 2.3 cm  Orientation of specimen: stitch lateral Lateral-yellow Deep-black Remaining-blue  Skin ellipse dimensions  description: 25.5 x 19.2 cm ellipse of tan skin, there are also multiple slightly raised granular tan to brown skin lesion on the medial to inferior aspect ranging from 0.3-1.5 cm in greatest dimension  Nipple/ areola: 1.2 cm nipple and 7.0 x 4.5 cm  areola  Axillary tail: no appreciable                           Biopsy site(s): none identified  Description of remaining breast: yellow lobulated fibrous  and fatty with focal cysts  Lymph nodes: no identified  Block Summary: 1-5-representative tissue in upper inner quadrant 6-7-representative lower inner quadrant 8-9-representative upper outer quadrant 10-11-representative lower inner quadrant 12-representative nipple and areola and representative skin lesions  Final Diagnosis performed by Quay Burow, MD.  Electronically signed 01/10/2017 8:48:02AM   The electronic signature indicates that the named Attending Pathologist has evaluated the specimen  Technical component performed at [facility], [address], [place], [place] [address] Lab: [phone] Dir: Rush Farmer, MD, MMM  Professional component performed at [facility], [facility], [place], [place], [place] [address] Lab: [phone] Dir: Dellia Nims. Reuel Derby, MD   Breast Biomarker Reporting Template: ER and PR  BREAST BIOMARKER TESTS                          Estrogen Receptor (ER) Status: Negative Internal controls worked appropriately Progesterone Receptor (PgR) Status: Negative Internal controls worked appropriately.  METHODS Cold Ischemia and Fixation Times: Meet requirements specified in latest version of the ASCO/CAP guidelines Testing Performed on Block Number(s): B7 Fixative: Formalin Estrogen Receptor:  FDA cleared ([place]); Primary Antibody:  SP1 Progesterone Receptor: FDA cleared ([place]); Primary Antibody: 1E2  Immunohistochemistry controls worked appropriately. Slides were prepared by [facility] for Molecular Biology and Pathology, [place], [place], and interpreted by Quay Burow, MD.      Addendum #1 performed by Quay Burow, MD.  Electronically signed 01/11/2017 2:47:04PM    Technical component performed at [facility], [address], [place], [place] [address] Lab: [phone] Dir: Rush Farmer, MD, MMM  Professional component performed at [facility], [facility], [place], [place], [place] [address] Lab: [phone] Dir: Dellia Nims. Reuel Derby, MD    . Glucose-Capillary 01/08/2017 150* 65 - 99 mg/dL Final    Assessment:  Sandra Potter is a 72 y.o. female with left breast DCIS s/p bilateral mastectomy on 01/08/2017.  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  There was no invasive carcinoma.  Calcifications were associated with DCIS and benign epithelium.  Sentinel lymph node was negative.  Pathologic stage was pTis (DCIS) pN0(sn).  Right breast revealed atypical ductal hyperplasia.  ER and PR were negative.  She underwent ultrasound guided biopsy on 11/21/2016.  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   Bilateral screening mammogram on 10/24/2016 revealed a possible mass with distortion in the left breast.  Diagnostic mammogram and ultrasound on 11/08/2016 revealed a 7 x 5 x 3 mm mildly irregular, hypoechoic mass in the 1:30 o'clock position of the left breast. There were areas of questionable distortion in the upper outer left breast and small questionable mass like area in the 3:00 position of the left breast posteriorly. There was no sonographic correlate.    Breast MRI on 11/13/20218 revealed 8 mm faint enhancement within the anterior upper-outer left breast with biopsy clip artifact compatible with biopsy-proven DCIS/post biopsy changes.  There were two separate areas of stippled nodular non masslike enhancement within the left breast - 2 x 1.8 x 2 cm in  the anterior lower inner left breast and 5 x 8.5 x 4.5 cm in the upper/upper-outer left breast - suspicious for DCIS. There was no MR evidence of abnormal lymph nodes or right breast malignancy.  Bone density study on 11/01/2015 revealed osteopenia with a T-score of -2.3 in the left femoral neck, -2.1 in the left hip, and -0.7 in the AP spine L1-L4.  She has thalassemia minor.  She has a mild microcytic anemia.  Symptomatically, she feels good.  Exam  reveals post-operative changes. She has multiple dark and irregular nevi to her chest and abdomen.   Plan: 1.  Labs today:  CBC with diff, CMP. 2.  Refer to dermatology Phillip Heal, MD) 3.  RTC in 1 year for MD assessment and labs (CBC with diff, CMP).   Honor Loh, NP  07/30/2017, 2:57 PM   I saw and evaluated the patient, participating in the key portions of the service and reviewing pertinent diagnostic studies and records.  I reviewed the nurse practitioner's note and agree with the findings and the plan.  The assessment and plan were discussed with the patient.  Several questions were asked by the patient and answered.   Nolon Stalls, MD 07/30/2017,2:57 PM

## 2017-07-30 NOTE — Progress Notes (Signed)
Patient here today for follow up.   

## 2017-08-01 ENCOUNTER — Other Ambulatory Visit: Payer: Medicare Other

## 2017-08-01 ENCOUNTER — Ambulatory Visit: Payer: Medicare Other | Admitting: Hematology and Oncology

## 2018-05-09 IMAGING — MR MR BILATERAL BREAST WITHOUT AND WITH CONTRAST
5 of 10 series · 19 of 48 positions shown · IV contrast (Yes   MULTIHANCE)
Comparison: Previous exam(s).

CLINICAL DATA: 71-year-old female with newly diagnosed left breast
DCIS.

LABS:  None performed today
EXAM:
BILATERAL BREAST MRI WITH AND WITHOUT CONTRAST
TECHNIQUE: Multiplanar, multisequence MR images of both breasts were obtained
prior to and following the intravenous administration of 18 ml of
MultiHance.

[Series 1: (phone_number) · axial · 7.0mm · 1.56mm/px · z∈[-86,+199]mm · 2 of 25 slices shown]
[im 1/25]
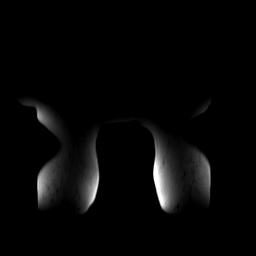
[im 25/25]
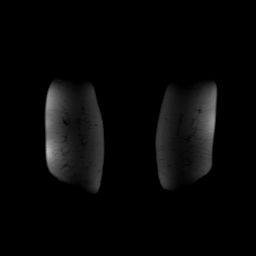

[Series 4: ax ir · axial · 3.0mm · 0.76mm/px · z∈[-136,+137]mm · 2 of 88 slices shown]
[im 1/88]
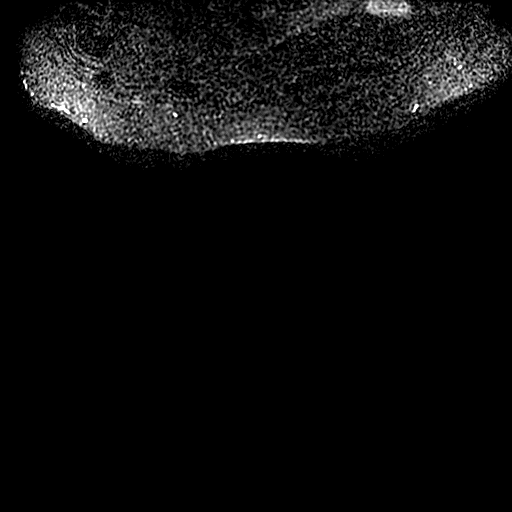
[im 88/88]
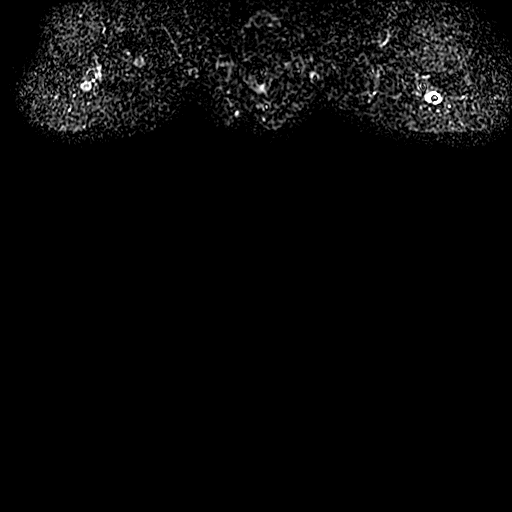

[Series 600: (id) ax vibrant · axial · 1.8mm · 0.74mm/px · z∈[-130,+138]mm · 6 of 292 slices shown]
[im 1/292]
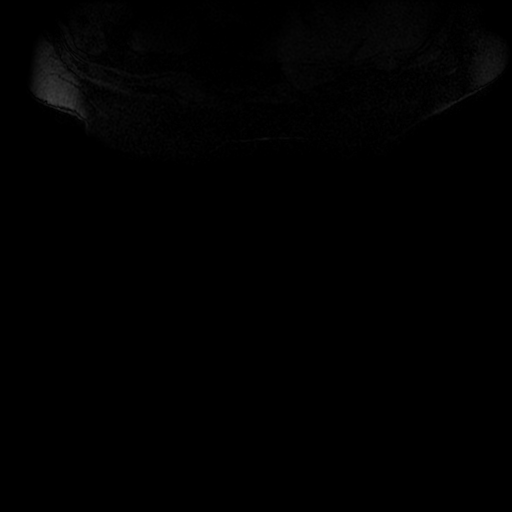
[im 59/292]
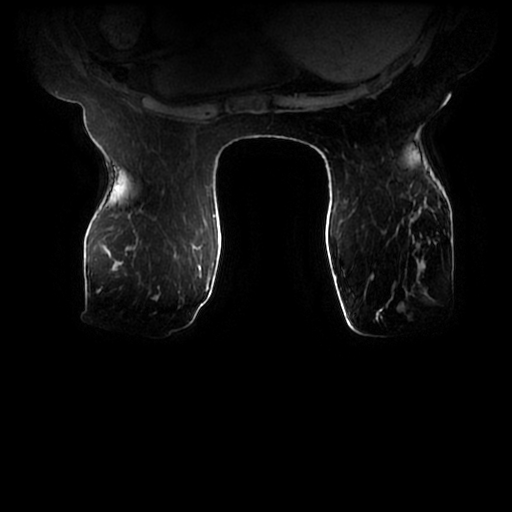
[im 117/292]
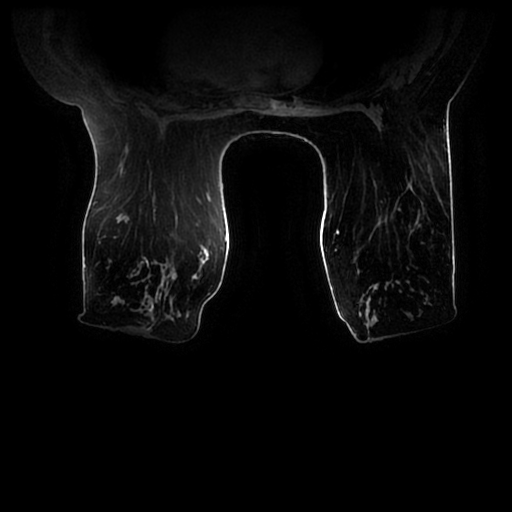
[im 175/292]
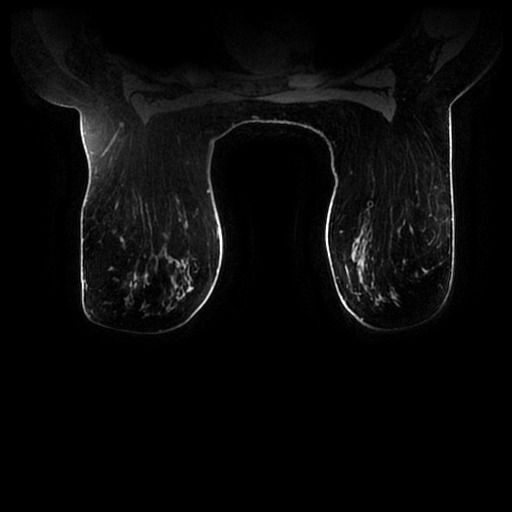
[im 233/292]
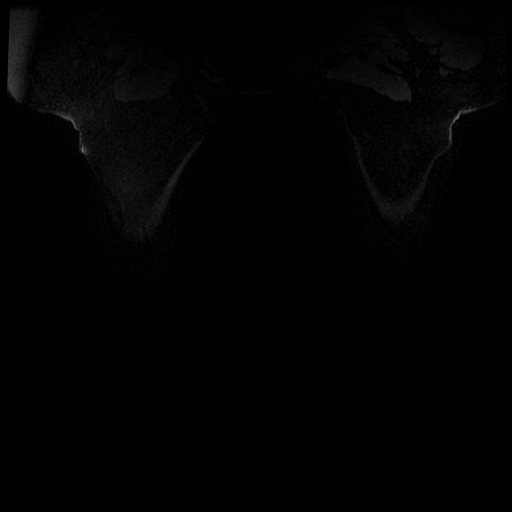
[im 292/292]
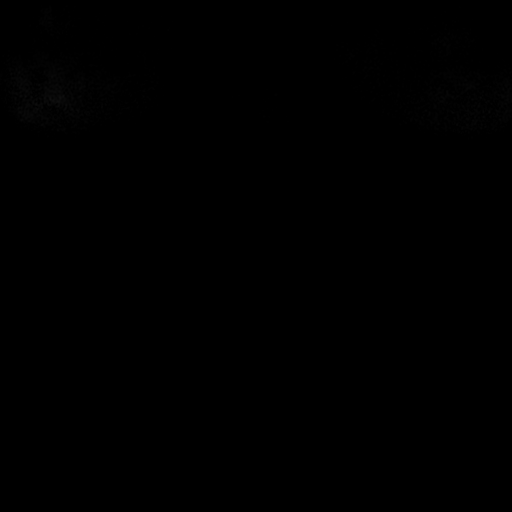

[Series 601: ph1/(id) ax vibrant · axial · 1.8mm · 0.74mm/px · z∈[-130,+138]mm · 6 of 287 slices shown]
[im 1/287]
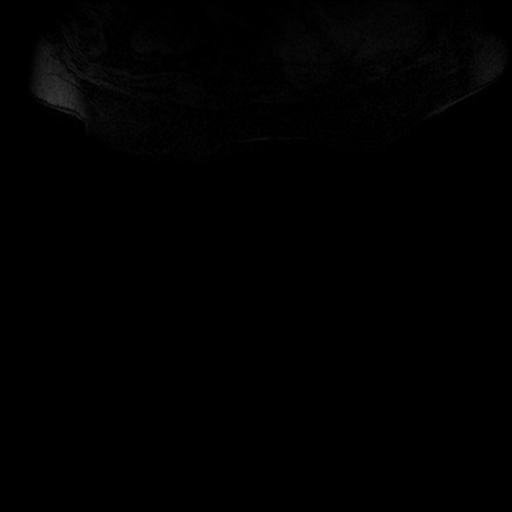
[im 58/287]
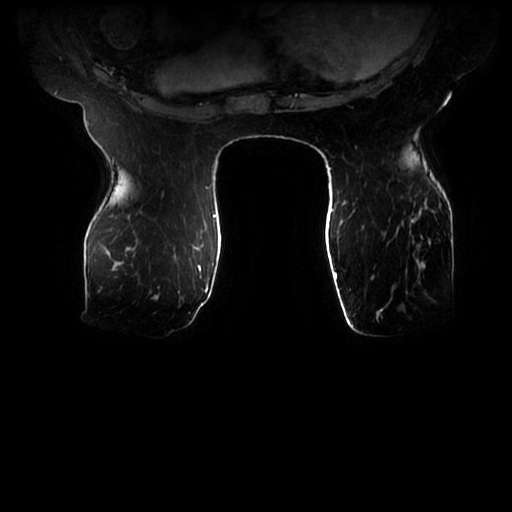
[im 115/287]
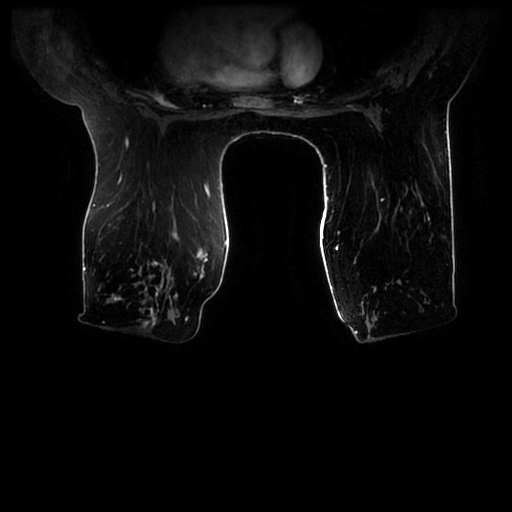
[im 172/287]
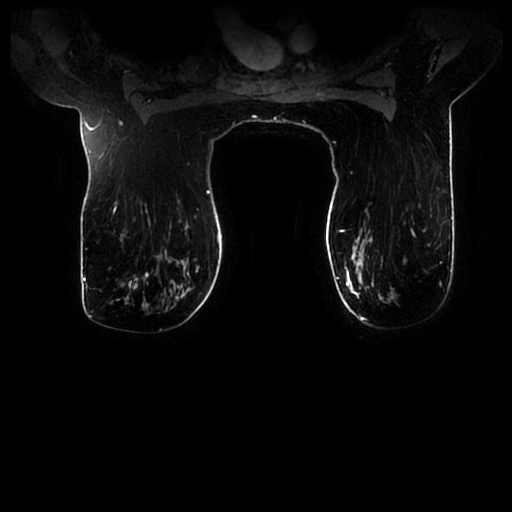
[im 229/287]
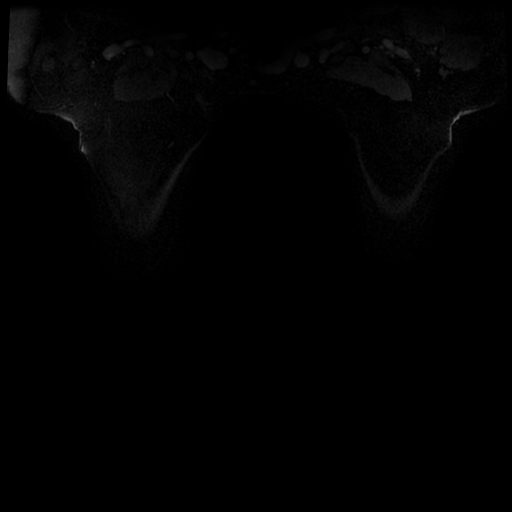
[im 287/287]
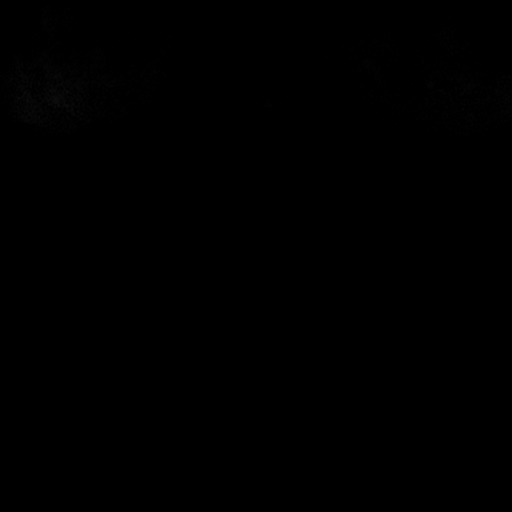

[Series 602: ph2/(id) ax vibrant · axial · 1.8mm · 0.74mm/px · z∈[-130,+30]mm · 3 of 242 slices shown]
[im 1/242]
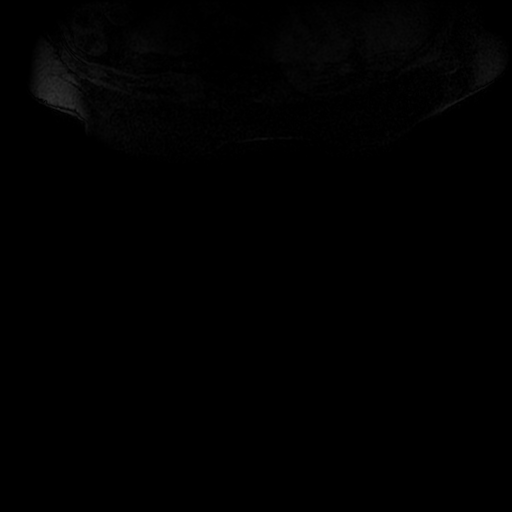
[im 61/242]
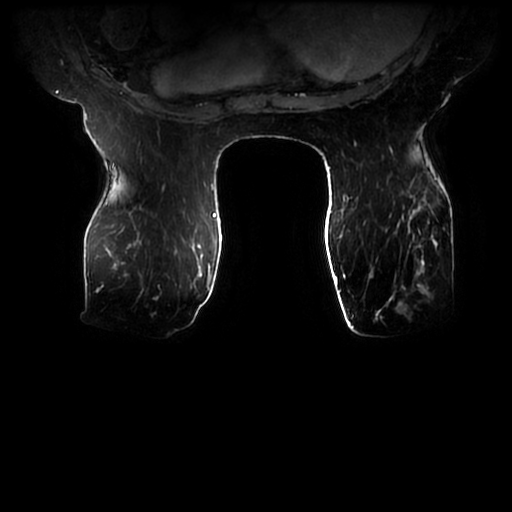
[im 121/242]
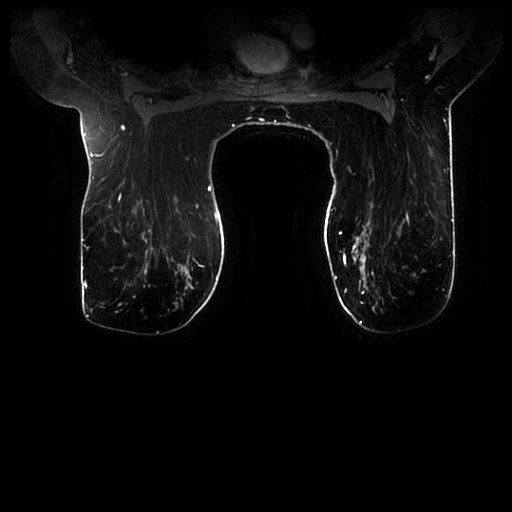

[19 of 48 positions shown; findings below may reference images not displayed]

THREE-DIMENSIONAL MR IMAGE RENDERING ON INDEPENDENT WORKSTATION:

Three-dimensional MR images were rendered by post-processing of the
original MR data on an independent workstation. The
three-dimensional MR images were interpreted, and findings are
reported in the following complete MRI report for this study. Three
dimensional images were evaluated at the independent [employer]
workstation
FINDINGS: Breast composition: b. Scattered fibroglandular tissue.

Background parenchymal enhancement: Mild

Right breast: No mass or abnormal enhancement.

Left breast: An 8 mm area of faint enhancement is noted within the
upper-outer left breast (anterior third image 110, series 03330)
with adjacent biopsy clip artifact compatible with biopsy-proven
DCIS/ post procedure changes.

Suspicious stippled nodular non masslike enhancement is noted within
the left breast as follows:

A 2 x 1.8 x 2 cm area within the lower inner left breast (anterior
third, images 205-213, series 03330).

A 5 x 8.5 x 4.5 cm area within the upper/ upper-outer left breast
(anterior and middle thirds, images 96-140, series 03330).

Lymph nodes: No abnormal appearing lymph nodes.

Ancillary findings:  None.
IMPRESSION: 1. 8 mm faint enhancement within the anterior upper-outer left
breast with biopsy clip artifact compatible with biopsy-proven
DCIS/post biopsy changes.
2. Two separate areas of stippled nodular non masslike enhancement
within the left breast - 2 x 1.8 x 2 cm in the anterior lower inner
left breast and 5 x 8.5 x 4.5 cm in the upper/upper-outer left
breast - suspicious for DCIS. Recommend MR guided biopsies of the
lower inner left breast enhancement and posterior aspect of the
upper/upper-outer left breast enhancement to determine disease
extent, if patient desires breast conservation.
3. No MR evidence of abnormal lymph nodes or right breast
malignancy.

RECOMMENDATION:
1. If breast conservation is desired, MR guided biopsies of
suspicious non masslike enhancement within 2 areas of the left
breast as described above.

BI-RADS CATEGORY  4: Suspicious.

## 2018-07-30 ENCOUNTER — Other Ambulatory Visit: Payer: Self-pay

## 2018-07-30 NOTE — Progress Notes (Signed)
[facility]  [address] [place], [place] [address] Phone: [phone]  Fax: [phone]   Clinic Day:  07/31/2018  Referring physician: Baxter Hire, MD  Chief Complaint: Sandra Potter is a 73 y.o. female with left breast DCIS s/p bilateral mastectomy who is seen for 12 month assessment.  HPI: The patient was last seen in the medical oncology clinic on 07/30/2017. At that time, she felt good.  Exam revealed post-operative changes. She had multiple dark and irregular nevi to her chest and abdomen.  Labs were unremarkable. She was referred to dermatology.  She was seen by her surgeon, Dr. Windell Moment on 01/23/2018.   During the interim, she is doing well. She reports pain and sensations at her surgical scar that the skin is loosening up. Weight is up 6lbs. She notes she has not been able to use a treadmill because the gym has been closed.   She had a basal cell carcinoma removed from her right forehead. She is followed in dermatology by Dr. Phillip Heal. Her bone density scan and colonoscopy will be scheduled by her PCP Dr. Edwina Barth or Dr. Ouida Sills in obstetrics.   She has a family history of breast cancer in her mother and endometrial cancer in her sister. She is interested in genetic testing.    Past Medical History:  Diagnosis Date  . Allergy   . Anxiety    mild  . Arthritis   . Breast cancer ([place])    new diagnosis  . Cataract   . Complication of anesthesia    throat sore after being intubated, suggested to use smaller airway to prevent pain afterwards  . Glaucoma   . Hypertension   . Osteoporosis   . Pre-diabetes   . Skin cancer   . Thalassemia minor   . Thalassemia minor     Past Surgical History:  Procedure Laterality Date  . ADENOIDECTOMY  1956  . APPENDECTOMY    . basal cell removal  1995  . BREAST BIOPSY Right 1990s   neg. result.   Marland Kitchen CATARACT EXTRACTION Right 2001  . CATARACT EXTRACTION Left 2011  . COLONOSCOPY   2005  . COLONOSCOPY  2015  . MOLE REMOVAL Right 01/08/2017   Procedure: REMOVAL OF SEBORRHEIC KERATOSES, RIGHT BREAST;  Surgeon: Leonie Green, MD;  Location: [facility];  Service: General;  Laterality: Right;  . PAROTID GLAND TUMOR EXCISION  2005  . RADIOLOGY WITH ANESTHESIA N/A 12/18/2016   Procedure: BREAST MRI WITH AND WITHOUT CONTRAST;  Surgeon: Radiologist, Medication, MD;  Location: [place] [facility];  Service: Radiology;  Laterality: N/A;  . SENTINEL NODE BIOPSY Left 01/08/2017   Procedure: SENTINEL NODE BIOPSY;  Surgeon: Leonie Green, MD;  Location: [facility];  Service: General;  Laterality: Left;  . SIMPLE MASTECTOMY WITH AXILLARY SENTINEL NODE BIOPSY Bilateral 01/08/2017   Procedure: SIMPLE MASTECTOMY;  Surgeon: Leonie Green, MD;  Location: [facility];  Service: General;  Laterality: Bilateral;    Family History  Problem Relation Age of Onset  . Breast cancer Mother   . Lymphoma Mother   . Kidney cancer Father   . Endometrial cancer Sister   . Endometrial cancer Maternal Grandmother     Social History:  reports that she quit smoking about 20 years ago. She has a 33.00 pack-year smoking history. She has never used smokeless tobacco. She reports current alcohol use of about 5.0 - 7.0 standard drinks of alcohol per week. She reports that she does not use drugs.  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  She likes to hike.  She previously smoked 1 pack/day x 33 years.  She stopped smoking in 08/99.  She drinks 5-7 glasses wine/week.  Her sister's name is Lelon Frohlich and daughter's name is Pamala Hurry.  She is alone today.  Allergies:  Allergies  Allergen Reactions  . Dust Mite Extract Other (See Comments) and Cough    Also watery eyes and sinus congestion  . Mold Extract [Trichophyton] Other (See Comments) and Cough    Also sinus congestion and watery eyes.  . Statins Other (See Comments)    Muscle pain  . Ativan  [Lorazepam] Anxiety and Other (See Comments)    Panic attacks  . E-Mycin [Erythromycin] Rash  . Prozac [Fluoxetine Hcl] Anxiety  . Valium [Diazepam] Anxiety and Other (See Comments)    Panic attacks  . Xanax [Alprazolam] Anxiety    Current Medications: Current Outpatient Medications  Medication Sig Dispense Refill  . aspirin EC 81 MG tablet Take 81 mg by mouth daily.    . butabarbital (BUTISOL SODIUM) 30 MG tablet Take 30 mg 2 (two) times daily as needed by mouth (for anxiety).     . Calcium Carbonate-Vit D-Min (CALTRATE 600+D PLUS PO) Take 1 tablet 2 (two) times daily by mouth.     . cyclobenzaprine (FLEXERIL) 5 MG tablet Take 5 mg 3 (three) times daily as needed by mouth for muscle spasms.     . Misc Natural Products (GLUCOSAMINE CHOND COMPLEX/MSM) TABS Take 2 tablets 2 (two) times daily by mouth. Joint Advantage    . Multiple Vitamins-Minerals (CENTRUM SILVER ULTRA WOMENS) TABS Take 1 tablet by mouth daily.    . TH COD LIVER OIL CAPS Take 1 tablet by mouth daily.    . timolol (TIMOPTIC) 0.5 % ophthalmic solution Place 1 drop into both eyes daily.    Marland Kitchen triamterene-hydrochlorothiazide (DYAZIDE) 37.5-25 MG capsule Take 1 capsule by mouth daily.    . vitamin E 400 UNIT capsule Take 400 Units by mouth daily.    . diphenhydrAMINE (BENADRYL) 25 mg capsule Take 25 mg daily as needed by mouth for allergies.     Marland Kitchen ibuprofen (ADVIL,MOTRIN) 200 MG tablet Take 400 mg every 6 (six) hours as needed by mouth for headache or moderate pain.     Marland Kitchen oxymetazoline (AFRIN) 0.05 % nasal spray Place 2 sprays 2 (two) times daily as needed into the nose for congestion (mostly at bedtime).     . sodium chloride (OCEAN) 0.65 % SOLN nasal spray Place 2 sprays as needed into both nostrils for congestion.      No current facility-administered medications for this visit.     Review of Systems  Constitutional: Negative for diaphoresis, fever, malaise/fatigue and weight loss (Up 6lbs).  HENT: Negative.  Negative for  congestion, hearing loss, sinus pain and sore throat.   Eyes: Negative.  Negative for blurred vision.  Respiratory: Negative for cough, hemoptysis, sputum production and shortness of breath.   Cardiovascular: Negative for chest pain, palpitations, orthopnea, leg swelling and PND.  Gastrointestinal: Negative for abdominal pain, blood in stool, constipation, diarrhea, melena, nausea and vomiting.  Genitourinary: Negative for dysuria, frequency, hematuria and urgency.  Musculoskeletal: Negative for back pain, falls, joint pain and myalgias.       Occasional pain in chest at surgical scars  Skin: Negative for itching and rash.       Multiple dark and irregular nevi to anterior  torso.   Neurological: Negative for dizziness, tingling, sensory change, weakness and headaches.  Endo/Heme/Allergies: Does not bruise/bleed easily.  Psychiatric/Behavioral: Negative for depression, memory loss and suicidal ideas. The patient is not nervous/anxious and does not have insomnia.   All other systems reviewed and are negative.  Performance status (ECOG): 0  Vitals: Blood pressure 132/62, pulse 90, temperature 97.8 F (36.6 C), temperature source Tympanic, height 5\' 8"  (1.727 m), weight 189 lb 9.5 oz (86 kg), SpO2 99 %.   Physical Exam  Constitutional: She is oriented to person, place, and time. She appears well-developed and well-nourished. No distress.  HENT:  Head: Normocephalic and atraumatic.  Mouth/Throat: Oropharynx is clear and moist. No oropharyngeal exudate.  Styled short gray hair. Mask.  Eyes: Pupils are equal, round, and reactive to light. Conjunctivae and EOM are normal. No scleral icterus.  Glasses.  Blue eyes.  Neck: Normal range of motion. Neck supple. No JVD present.  Cardiovascular: Normal rate, regular rhythm and normal heart sounds.  No murmur heard. Pulmonary/Chest: Effort normal and breath sounds normal. No respiratory distress. She has no wheezes. She has no rales. Right breast  exhibits no mass, no skin change and no tenderness. Left breast exhibits no mass, no skin change and no tenderness. Breasts are asymmetrical (s/p bilateral mastectomies).  Abdominal: Soft. Bowel sounds are normal. She exhibits no distension and no mass. There is no abdominal tenderness. There is no rebound and no guarding.  Musculoskeletal: Normal range of motion.        General: No edema.  Lymphadenopathy:    She has no cervical adenopathy.    She has no axillary adenopathy.       Right: No supraclavicular adenopathy present.       Left: No supraclavicular adenopathy present.  Neurological: She is alert and oriented to person, place, and time.  Skin: Skin is warm and dry. She is not diaphoretic. No erythema.  Psychiatric: She has a normal mood and affect. Her behavior is normal. Judgment and thought content normal.  Nursing note and vitals reviewed.   Appointment on 07/31/2018  Component Date Value Ref Range Status  . Sodium 07/31/2018 129* 135 - 145 mmol/L Final  . Potassium 07/31/2018 3.8  3.5 - 5.1 mmol/L Final  . Chloride 07/31/2018 92* 98 - 111 mmol/L Final  . CO2 07/31/2018 27  22 - 32 mmol/L Final  . Glucose, Bld 07/31/2018 88  70 - 99 mg/dL Final  . BUN 07/31/2018 16  8 - 23 mg/dL Final  . Creatinine, Ser 07/31/2018 0.56  0.44 - 1.00 mg/dL Final  . Calcium 07/31/2018 9.1  8.9 - 10.3 mg/dL Final  . Total Protein 07/31/2018 7.4  6.5 - 8.1 g/dL Final  . Albumin 07/31/2018 4.2  3.5 - 5.0 g/dL Final  . AST 07/31/2018 20  15 - 41 U/L Final  . ALT 07/31/2018 20  0 - 44 U/L Final  . Alkaline Phosphatase 07/31/2018 50  38 - 126 U/L Final  . Total Bilirubin 07/31/2018 0.5  0.3 - 1.2 mg/dL Final  . GFR calc non Af Amer 07/31/2018 >60  >60 mL/min Final  . GFR calc Af Amer 07/31/2018 >60  >60 mL/min Final  . Anion gap 07/31/2018 10  5 - 15 Final   Performed at [facility], [address], [place], [place] [address]  . WBC 07/31/2018 6.7  4.0 - 10.5 K/uL Final  . RBC  07/31/2018 6.32* 3.87 - 5.11 MIL/uL Final  . Hemoglobin 07/31/2018 12.0  12.0 - 15.0 g/dL Final  . HCT 07/31/2018 38.2  36.0 - 46.0 % Final  . MCV 07/31/2018 60.4* 80.0 - 100.0 fL Final  . MCH 07/31/2018 19.0* 26.0 - 34.0 pg Final  . MCHC 07/31/2018 31.4  30.0 - 36.0 g/dL Final  . RDW 07/31/2018 18.4* 11.5 - 15.5 % Final  . Platelets 07/31/2018 296  150 - 400 K/uL Final  . nRBC 07/31/2018 0.0  0.0 - 0.2 % Final  . Neutrophils Relative % 07/31/2018 53  % Final  . Neutro Abs 07/31/2018 3.6  1.7 - 7.7 K/uL Final  . Lymphocytes Relative 07/31/2018 27  % Final  . Lymphs Abs 07/31/2018 1.8  0.7 - 4.0 K/uL Final  . Monocytes Relative 07/31/2018 13  % Final  . Monocytes Absolute 07/31/2018 0.9  0.1 - 1.0 K/uL Final  . Eosinophils Relative 07/31/2018 5  % Final  . Eosinophils Absolute 07/31/2018 0.3  0.0 - 0.5 K/uL Final  . Basophils Relative 07/31/2018 1  % Final  . Basophils Absolute 07/31/2018 0.1  0.0 - 0.1 K/uL Final  . Immature Granulocytes 07/31/2018 1  % Final  . Abs Immature Granulocytes 07/31/2018 0.03  0.00 - 0.07 K/uL Final   Performed at [facility], [address] [place], [place] [address]    Assessment:  Sandra Potter is a 73 y.o. female with left breast DCIS s/p bilateral mastectomy on 01/08/2017.  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  There was no invasive carcinoma.  Calcifications were associated with DCIS and benign epithelium.  Sentinel lymph node was negative.  Pathologic stage was pTis (DCIS) pN0(sn).  Right breast revealed atypical ductal hyperplasia.  ER and PR were negative.  She underwent ultrasound guided biopsy on 11/21/2016.  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   Bilateral screening mammogram on 10/24/2016 revealed a possible mass with distortion in the left breast.  Diagnostic mammogram and ultrasound on 11/08/2016 revealed a 7 x 5 x 3 mm mildly irregular, hypoechoic mass in the 1:30  o'clock position of the left breast. There were areas of questionable distortion in the upper outer left breast and small questionable mass like area in the 3:00 position of the left breast posteriorly. There was no sonographic correlate.    Breast MRI on 11/13/20218 revealed 8 mm faint enhancement within the anterior upper-outer left breast with biopsy clip artifact compatible with biopsy-proven DCIS/post biopsy changes.  There were two separate areas of stippled nodular non masslike enhancement within the left breast - 2 x 1.8 x 2 cm in the anterior lower inner left breast and 5 x 8.5 x 4.5 cm in the upper/upper-outer left breast - suspicious for DCIS. There was no MR evidence of abnormal lymph nodes or right breast malignancy.  Bone density study on 11/01/2015 revealed osteopenia with a T-score of -2.3 in the left femoral neck, -2.1 in the left hip, and -0.7 in the AP spine L1-L4.  She has thalassemia minor.  She has a mild microcytic anemia.  Symptomatically, she is doing well.  Exam is unremarkable.  Plan: 1.   Labs today:  CBC with diff, CMP.  2.   Left breast DCIS  Clinically she is doing well.  Exam reveals no evidence of recurrence. 3.   Family history of malignancy  Genetics referral. 4.   RTC in 1 year for MD assessment and labs (CBC with diff, CMP).  I discussed the assessment and treatment plan with the  patient.  The patient was provided an opportunity to ask questions and all were answered.  The patient agreed with the plan and demonstrated an understanding of the instructions.  The patient was advised to call back if the symptoms worsen or if the condition fails to improve as anticipated.  I provided 15 minutes of face-to-face time during this this encounter and > 50% was spent counseling as documented under my assessment and plan.    Lequita Asal, MD, PhD    07/31/2018, 2:57 PM  I, Molly Dorshimer, am acting as Education administrator for [employer]. Mike Gip, MD, PhD.  I, Melissa  C. Mike Gip, MD, have reviewed the above documentation for accuracy and completeness, and I agree with the above.

## 2018-07-31 ENCOUNTER — Other Ambulatory Visit: Payer: Medicare Other

## 2018-07-31 ENCOUNTER — Inpatient Hospital Stay (HOSPITAL_BASED_OUTPATIENT_CLINIC_OR_DEPARTMENT_OTHER): Payer: Medicare Other | Admitting: Hematology and Oncology

## 2018-07-31 ENCOUNTER — Encounter: Payer: Self-pay | Admitting: Hematology and Oncology

## 2018-07-31 ENCOUNTER — Ambulatory Visit: Payer: Medicare Other | Admitting: Hematology and Oncology

## 2018-07-31 ENCOUNTER — Inpatient Hospital Stay: Payer: Medicare Other | Attending: Hematology and Oncology

## 2018-07-31 ENCOUNTER — Other Ambulatory Visit: Payer: Self-pay

## 2018-07-31 VITALS — BP 132/62 | HR 90 | Temp 97.8°F | Ht 68.0 in | Wt 189.6 lb

## 2018-07-31 DIAGNOSIS — Z86000 Personal history of in-situ neoplasm of breast: Secondary | ICD-10-CM | POA: Diagnosis present

## 2018-07-31 DIAGNOSIS — D0512 Intraductal carcinoma in situ of left breast: Secondary | ICD-10-CM

## 2018-07-31 DIAGNOSIS — D563 Thalassemia minor: Secondary | ICD-10-CM | POA: Diagnosis not present

## 2018-07-31 LAB — CBC WITH DIFFERENTIAL/PLATELET
Abs Immature Granulocytes: 0.03 10*3/uL (ref 0.00–0.07)
Basophils Absolute: 0.1 10*3/uL (ref 0.0–0.1)
Basophils Relative: 1 %
Eosinophils Absolute: 0.3 10*3/uL (ref 0.0–0.5)
Eosinophils Relative: 5 %
HCT: 38.2 % (ref 36.0–46.0)
Hemoglobin: 12 g/dL (ref 12.0–15.0)
Immature Granulocytes: 1 %
Lymphocytes Relative: 27 %
Lymphs Abs: 1.8 10*3/uL (ref 0.7–4.0)
MCH: 19 pg — ABNORMAL LOW (ref 26.0–34.0)
MCHC: 31.4 g/dL (ref 30.0–36.0)
MCV: 60.4 fL — ABNORMAL LOW (ref 80.0–100.0)
Monocytes Absolute: 0.9 10*3/uL (ref 0.1–1.0)
Monocytes Relative: 13 %
Neutro Abs: 3.6 10*3/uL (ref 1.7–7.7)
Neutrophils Relative %: 53 %
Platelets: 296 10*3/uL (ref 150–400)
RBC: 6.32 MIL/uL — ABNORMAL HIGH (ref 3.87–5.11)
RDW: 18.4 % — ABNORMAL HIGH (ref 11.5–15.5)
WBC: 6.7 10*3/uL (ref 4.0–10.5)
nRBC: 0 % (ref 0.0–0.2)

## 2018-07-31 LAB — COMPREHENSIVE METABOLIC PANEL
ALT: 20 U/L (ref 0–44)
AST: 20 U/L (ref 15–41)
Albumin: 4.2 g/dL (ref 3.5–5.0)
Alkaline Phosphatase: 50 U/L (ref 38–126)
Anion gap: 10 (ref 5–15)
BUN: 16 mg/dL (ref 8–23)
CO2: 27 mmol/L (ref 22–32)
Calcium: 9.1 mg/dL (ref 8.9–10.3)
Chloride: 92 mmol/L — ABNORMAL LOW (ref 98–111)
Creatinine, Ser: 0.56 mg/dL (ref 0.44–1.00)
GFR calc Af Amer: >60 mL/min (ref >60)
GFR calc non Af Amer: >60 mL/min (ref >60)
Glucose, Bld: 88 mg/dL (ref 70–99)
Potassium: 3.8 mmol/L (ref 3.5–5.1)
Sodium: 129 mmol/L — ABNORMAL LOW (ref 135–145)
Total Bilirubin: 0.5 mg/dL (ref 0.3–1.2)
Total Protein: 7.4 g/dL (ref 6.5–8.1)

## 2018-07-31 NOTE — Progress Notes (Signed)
No new changes noted today 

## 2019-07-29 ENCOUNTER — Encounter: Payer: Self-pay | Admitting: Hematology and Oncology

## 2019-07-29 ENCOUNTER — Other Ambulatory Visit: Payer: Self-pay

## 2019-07-29 NOTE — Progress Notes (Signed)
No new changes noted today. The patient Name and DOB has been verified by phone today. 

## 2019-07-29 NOTE — Progress Notes (Signed)
[facility]  [address] [place], [place] [address] Phone: [phone]  Fax: [phone]   Clinic Day:  07/30/2019  Referring physician: Baxter Hire, MD  Chief Complaint: Sandra Potter is a 74 y.o. female with left breast DCIS s/p bilateral mastectomy who is seen for 1 year assessment.  HPI: The patient was last seen in the medical oncology clinic on 07/31/2018. At that time, she was doing well. Exam was unremarkable. Hematocrit was 38.2, hemoglobin 12.0, platelets 296,000, WBC 6,700. Sodium was 129. She was referred to genetics.  She saw Dr. Ouida Sills, OBGYN, on 10/30/2018 for screening ultrasound for ovarian cancer. Transabdominal ultrasound revealed a slightly thickened endometrium (5 mm).  Ovaries were normal.  She had an annual visit with Dr. Edwina Barth on 04/21/2019.  She denied any complaints.  Hyponatremia was felt likely secondary to diuretics.  Hematocrit was 38.6, hemoglobin 11.8, MCV 61.8, platelets 314,000, WBC 6,500. Sodium was 131.  During the interim, she has been "fine". She is still taking her diuretic. She reports that her sodium has been low since she started taking triamterene. She is still having a "tight sensation" on her chest near her surgical scar. The patient had genetic testing; everything was negative. She sees her dermatologist yearly.  The patient received her second COVID-19 vaccine on 04/08/2019.   Past Medical History:  Diagnosis Date  . Allergy   . Anxiety    mild  . Arthritis   . Breast cancer ([place])    new diagnosis  . Cataract   . Complication of anesthesia    throat sore after being intubated, suggested to use smaller airway to prevent pain afterwards  . Glaucoma   . Hypertension   . Osteoporosis   . Pre-diabetes   . Skin cancer   . Thalassemia minor   . Thalassemia minor     Past Surgical History:  Procedure Laterality Date  . ADENOIDECTOMY  1956  . APPENDECTOMY    . basal cell  removal  1995  . BREAST BIOPSY Right 1990s   neg. result.   Marland Kitchen CATARACT EXTRACTION Right 2001  . CATARACT EXTRACTION Left 2011  . COLONOSCOPY  2005  . COLONOSCOPY  2015  . MOLE REMOVAL Right 01/08/2017   Procedure: REMOVAL OF SEBORRHEIC KERATOSES, RIGHT BREAST;  Surgeon: Leonie Green, MD;  Location: [facility];  Service: General;  Laterality: Right;  . PAROTID GLAND TUMOR EXCISION  2005  . RADIOLOGY WITH ANESTHESIA N/A 12/18/2016   Procedure: BREAST MRI WITH AND WITHOUT CONTRAST;  Surgeon: Radiologist, Medication, MD;  Location: [place];  Service: Radiology;  Laterality: N/A;  . SENTINEL NODE BIOPSY Left 01/08/2017   Procedure: SENTINEL NODE BIOPSY;  Surgeon: Leonie Green, MD;  Location: [facility];  Service: General;  Laterality: Left;  . SIMPLE MASTECTOMY WITH AXILLARY SENTINEL NODE BIOPSY Bilateral 01/08/2017   Procedure: SIMPLE MASTECTOMY;  Surgeon: Leonie Green, MD;  Location: [facility];  Service: General;  Laterality: Bilateral;    Family History  Problem Relation Age of Onset  . Breast cancer Mother   . Lymphoma Mother   . Kidney cancer Father   . Endometrial cancer Sister   . Endometrial cancer Maternal Grandmother     Social History:  reports that she quit smoking about 21 years ago. She has a 33.00 pack-year smoking history. She has never used smokeless tobacco. She reports current alcohol use of about 5.0 - 7.0 standard drinks of alcohol per week. She reports that she does  not use drugs. 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  She likes to hike.  She previously smoked 1 pack/day x 33 years.  She stopped smoking in 08/99.  She drinks 5-7 glasses wine/week.  Her sister's name is Sandra Potter and daughter's name is Sandra Potter.  She is alone today.  Allergies:  Allergies  Allergen Reactions  . Dust Mite Extract Other (See Comments) and Cough    Also watery eyes and sinus congestion  . Mold Extract  [Trichophyton] Other (See Comments) and Cough    Also sinus congestion and watery eyes.  . Statins Other (See Comments)    Muscle pain  . Ativan [Lorazepam] Anxiety and Other (See Comments)    Panic attacks  . E-Mycin [Erythromycin] Rash  . Prozac [Fluoxetine Hcl] Anxiety  . Valium [Diazepam] Anxiety and Other (See Comments)    Panic attacks  . Xanax [Alprazolam] Anxiety    Current Medications: Current Outpatient Medications  Medication Sig Dispense Refill  . aspirin EC 81 MG tablet Take 81 mg by mouth daily.    . butabarbital (BUTISOL SODIUM) 30 MG tablet Take 30 mg 2 (two) times daily as needed by mouth (for anxiety).     . Calcium Carbonate-Vit D-Min (CALTRATE 600+D PLUS PO) Take 1 tablet 2 (two) times daily by mouth.     . cyclobenzaprine (FLEXERIL) 5 MG tablet Take 5 mg 3 (three) times daily as needed by mouth for muscle spasms.     . diphenhydrAMINE (BENADRYL) 25 mg capsule Take 25 mg daily as needed by mouth for allergies.     Marland Kitchen ibuprofen (ADVIL,MOTRIN) 200 MG tablet Take 400 mg every 6 (six) hours as needed by mouth for headache or moderate pain.     . Misc Natural Products (GLUCOSAMINE CHOND COMPLEX/MSM) TABS Take 2 tablets 2 (two) times daily by mouth. Joint Advantage    . Multiple Vitamins-Minerals (CENTRUM SILVER ULTRA WOMENS) TABS Take 1 tablet by mouth daily.    Marland Kitchen oxymetazoline (AFRIN) 0.05 % nasal spray Place 2 sprays 2 (two) times daily as needed into the nose for congestion (mostly at bedtime).     . sodium chloride (OCEAN) 0.65 % SOLN nasal spray Place 2 sprays as needed into both nostrils for congestion.     . TH COD LIVER OIL CAPS Take 1 tablet by mouth daily.    . timolol (TIMOPTIC) 0.5 % ophthalmic solution Place 1 drop into both eyes daily.    Marland Kitchen triamterene-hydrochlorothiazide (DYAZIDE) 37.5-25 MG capsule Take 1 capsule by mouth daily.    . vitamin E 400 UNIT capsule Take 400 Units by mouth daily.     No current facility-administered medications for this visit.      Review of Systems  Constitutional: Negative for chills, diaphoresis, fever, malaise/fatigue and weight loss (Up 6lbs).  HENT: Negative.  Negative for congestion, ear discharge, ear pain, hearing loss, nosebleeds, sinus pain, sore throat and tinnitus.   Eyes: Negative.  Negative for blurred vision.  Respiratory: Negative for cough, hemoptysis, sputum production and shortness of breath.   Cardiovascular: Negative for chest pain, palpitations and leg swelling.  Gastrointestinal: Negative for abdominal pain, blood in stool, constipation, diarrhea, heartburn, melena, nausea and vomiting.  Genitourinary: Negative for dysuria, frequency, hematuria and urgency.  Musculoskeletal: Negative for back pain, joint pain, myalgias and neck pain.       Occasional "tight sensation" on chest at surgical scars  Skin: Negative for itching and rash.  Neurological:  Negative for dizziness, tingling, sensory change, weakness and headaches.  Endo/Heme/Allergies: Does not bruise/bleed easily.  Psychiatric/Behavioral: Negative for depression, memory loss and suicidal ideas. The patient is not nervous/anxious and does not have insomnia.   All other systems reviewed and are negative.  Performance status (ECOG): 0  Vitals: Blood pressure (!) 143/73, pulse 92, temperature (!) 97.2 F (36.2 C), temperature source Tympanic, weight 185 lb 13.6 oz (84.3 kg), SpO2 99 %.   Physical Exam Vitals and nursing note reviewed.  Constitutional:      General: She is not in acute distress.    Appearance: She is well-developed. She is not diaphoretic.  HENT:     Head: Normocephalic and atraumatic.     Comments: Shoulder length gray hair.  Mask.    Mouth/Throat:     Mouth: Mucous membranes are moist.     Pharynx: Oropharynx is clear. No oropharyngeal exudate.  Eyes:     General: No scleral icterus.    Extraocular Movements: Extraocular movements intact.     Conjunctiva/sclera: Conjunctivae normal.     Pupils: Pupils are  equal, round, and reactive to light.     Comments: Glasses.  Blue eyes.  Neck:     Vascular: No JVD.  Cardiovascular:     Rate and Rhythm: Normal rate and regular rhythm.     Pulses: Normal pulses.     Heart sounds: Normal heart sounds. No murmur heard.   Pulmonary:     Effort: Pulmonary effort is normal. No respiratory distress.     Breath sounds: Normal breath sounds. No wheezing or rales.  Chest:     Chest wall: No tenderness.     Breasts:        Right: No mass, skin change or tenderness.        Left: No mass, skin change or tenderness.     Comments: s/p bilateral mastectomy.  No erythema or nodularity. Abdominal:     General: Bowel sounds are normal. There is no distension.     Palpations: Abdomen is soft. There is no mass.     Tenderness: There is no abdominal tenderness. There is no guarding or rebound.  Musculoskeletal:        General: No swelling or tenderness. Normal range of motion.     Cervical back: Normal range of motion and neck supple.  Lymphadenopathy:     Cervical: No cervical adenopathy.     Upper Body:     Right upper body: No supraclavicular adenopathy.     Left upper body: No supraclavicular adenopathy.  Skin:    General: Skin is warm and dry.     Findings: No erythema.     Comments: Multiple nevi.  Neurological:     Mental Status: She is alert and oriented to person, place, and time. Mental status is at baseline.  Psychiatric:        Mood and Affect: Mood normal.        Behavior: Behavior normal.        Thought Content: Thought content normal.        Judgment: Judgment normal.     Appointment on 07/30/2019  Component Date Value Ref Range Status  . Sodium 07/30/2019 127* 135 - 145 mmol/L Final  . Potassium 07/30/2019 3.9  3.5 - 5.1 mmol/L Final  . Chloride 07/30/2019 91* 98 - 111 mmol/L Final  . CO2 07/30/2019 30  22 - 32 mmol/L Final  . Glucose, Bld 07/30/2019 110* 70 - 99 mg/dL Final  Glucose reference range applies only to samples taken  after fasting for at least 8 hours.  . BUN 07/30/2019 14  8 - 23 mg/dL Final  . Creatinine, Ser 07/30/2019 0.57  0.44 - 1.00 mg/dL Final  . Calcium 07/30/2019 8.9  8.9 - 10.3 mg/dL Final  . Total Protein 07/30/2019 7.2  6.5 - 8.1 g/dL Final  . Albumin 07/30/2019 4.2  3.5 - 5.0 g/dL Final  . AST 07/30/2019 21  15 - 41 U/L Final  . ALT 07/30/2019 20  0 - 44 U/L Final  . Alkaline Phosphatase 07/30/2019 41  38 - 126 U/L Final  . Total Bilirubin 07/30/2019 0.6  0.3 - 1.2 mg/dL Final  . GFR calc non Af Amer 07/30/2019 >60  >60 mL/min Final  . GFR calc Af Amer 07/30/2019 >60  >60 mL/min Final  . Anion gap 07/30/2019 6  5 - 15 Final   Performed at [facility], [address] [place], [place] [address]    Assessment:  Sandra Potter is a 74 y.o. female with left breast DCIS s/p bilateral mastectomy on 01/08/2017.  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  There was no invasive carcinoma.  Calcifications were associated with DCIS and benign epithelium.  Sentinel lymph node was negative.  Pathologic stage was pTis (DCIS) pN0(sn).  Right breast revealed atypical ductal hyperplasia.  ER and PR were negative.  She underwent ultrasound guided biopsy on 11/21/2016.  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   Bilateral screening mammogram on 10/24/2016 revealed a possible mass with distortion in the left breast.  Diagnostic mammogram and ultrasound on 11/08/2016 revealed a 7 x 5 x 3 mm mildly irregular, hypoechoic mass in the 1:30 o'clock position of the left breast. There were areas of questionable distortion in the upper outer left breast and small questionable mass like area in the 3:00 position of the left breast posteriorly. There was no sonographic correlate.    Breast MRI on 12/18/2016 revealed 8 mm faint enhancement within the anterior upper-outer left breast with biopsy clip artifact compatible with biopsy-proven DCIS/post  biopsy changes.  There were two separate areas of stippled nodular non masslike enhancement within the left breast - 2 x 1.8 x 2 cm in the anterior lower inner left breast and 5 x 8.5 x 4.5 cm in the upper/upper-outer left breast - suspicious for DCIS. There was no MR evidence of abnormal lymph nodes or right breast malignancy.  Bone density study on 11/01/2015 revealed osteopenia with a T-score of -2.3 in the left femoral neck, -2.1 in the left hip, and -0.7 in the AP spine L1-L4.  She has thalassemia minor.  She has a mild microcytic anemia.  The patient received her second COVID-19 vaccine on 04/08/2019.  Symptomatically, she is doing well.  She notes a tight sensation near her surgical scar.  Exam is unremarkable.  Hemoglobin is 11.7 with an MCV of 59.6.  Sodium is 127.  Plan: 1.   Labs today:  CBC with diff, CMP. 2.   Left breast DCIS  She is s/p bilateral mastectomy.  Clinically, she is doing well.  Exam reveals post-operative changes and no evidence of recurrence. 3.   Family history of malignancy  Patient has been seen by genetics.  Per her report testing was negative.  Obtain copy of [facility] genetic testing.  Review interval transabdominal ultrasound report. 4.   Multiple nevi  Encourage annual follow-up with dermatology (Dr Phillip Heal).  5.   Hyponatremia  Sodium 127.  Etiology felt secondary to diuretics.  Patient and Dr Edwina Barth aware. 6.   RTC in 1 year for MD assessment and labs (CBC with diff, CMP).  I discussed the assessment and treatment plan with the patient.  The patient was provided an opportunity to ask questions and all were answered.  The patient agreed with the plan and demonstrated an understanding of the instructions.  The patient was advised to call back if the symptoms worsen or if the condition fails to improve as anticipated.   Lequita Asal, MD, PhD    07/30/2019, 2:18 PM  I, Mirian Mo Tufford, am acting as Education administrator for [employer]. Mike Gip, MD,  PhD.  I, Arli Bree C. Mike Gip, MD, have reviewed the above documentation for accuracy and completeness, and I agree with the above.

## 2019-07-30 ENCOUNTER — Inpatient Hospital Stay: Payer: Medicare PPO

## 2019-07-30 ENCOUNTER — Inpatient Hospital Stay: Payer: Medicare PPO | Attending: Hematology and Oncology | Admitting: Hematology and Oncology

## 2019-07-30 ENCOUNTER — Encounter: Payer: Self-pay | Admitting: Hematology and Oncology

## 2019-07-30 VITALS — BP 143/73 | HR 92 | Temp 97.2°F | Wt 185.8 lb

## 2019-07-30 DIAGNOSIS — Z853 Personal history of malignant neoplasm of breast: Secondary | ICD-10-CM | POA: Diagnosis present

## 2019-07-30 DIAGNOSIS — D0512 Intraductal carcinoma in situ of left breast: Secondary | ICD-10-CM

## 2019-07-30 DIAGNOSIS — Z08 Encounter for follow-up examination after completed treatment for malignant neoplasm: Secondary | ICD-10-CM | POA: Insufficient documentation

## 2019-07-30 DIAGNOSIS — Z9013 Acquired absence of bilateral breasts and nipples: Secondary | ICD-10-CM | POA: Diagnosis present

## 2019-07-30 DIAGNOSIS — E871 Hypo-osmolality and hyponatremia: Secondary | ICD-10-CM | POA: Diagnosis not present

## 2019-07-30 DIAGNOSIS — D563 Thalassemia minor: Secondary | ICD-10-CM | POA: Diagnosis not present

## 2019-07-30 LAB — COMPREHENSIVE METABOLIC PANEL
ALT: 20 U/L (ref 0–44)
AST: 21 U/L (ref 15–41)
Albumin: 4.2 g/dL (ref 3.5–5.0)
Alkaline Phosphatase: 41 U/L (ref 38–126)
Anion gap: 6 (ref 5–15)
BUN: 14 mg/dL (ref 8–23)
CO2: 30 mmol/L (ref 22–32)
Calcium: 8.9 mg/dL (ref 8.9–10.3)
Chloride: 91 mmol/L — ABNORMAL LOW (ref 98–111)
Creatinine, Ser: 0.57 mg/dL (ref 0.44–1.00)
GFR calc Af Amer: >60 mL/min (ref >60)
GFR calc non Af Amer: >60 mL/min (ref >60)
Glucose, Bld: 110 mg/dL — ABNORMAL HIGH (ref 70–99)
Potassium: 3.9 mmol/L (ref 3.5–5.1)
Sodium: 127 mmol/L — ABNORMAL LOW (ref 135–145)
Total Bilirubin: 0.6 mg/dL (ref 0.3–1.2)
Total Protein: 7.2 g/dL (ref 6.5–8.1)

## 2019-07-30 LAB — CBC WITH DIFFERENTIAL/PLATELET
Abs Immature Granulocytes: 0 10*3/uL (ref 0.00–0.07)
Basophils Absolute: 0.1 10*3/uL (ref 0.0–0.1)
Basophils Relative: 1 %
Eosinophils Absolute: 0.3 10*3/uL (ref 0.0–0.5)
Eosinophils Relative: 5 %
HCT: 37.4 % (ref 36.0–46.0)
Hemoglobin: 11.7 g/dL — ABNORMAL LOW (ref 12.0–15.0)
Immature Granulocytes: 0 %
Lymphocytes Relative: 27 %
Lymphs Abs: 1.7 10*3/uL (ref 0.7–4.0)
MCH: 18.6 pg — ABNORMAL LOW (ref 26.0–34.0)
MCHC: 31.3 g/dL (ref 30.0–36.0)
MCV: 59.6 fL — ABNORMAL LOW (ref 80.0–100.0)
Monocytes Absolute: 0.8 10*3/uL (ref 0.1–1.0)
Monocytes Relative: 13 %
Neutro Abs: 3.3 10*3/uL (ref 1.7–7.7)
Neutrophils Relative %: 54 %
Platelets: 266 10*3/uL (ref 150–400)
RBC Morphology: NONE SEEN
RBC: 6.28 MIL/uL — ABNORMAL HIGH (ref 3.87–5.11)
RDW: 18.6 % — ABNORMAL HIGH (ref 11.5–15.5)
WBC: 6.1 10*3/uL (ref 4.0–10.5)

## 2019-07-31 DIAGNOSIS — E871 Hypo-osmolality and hyponatremia: Secondary | ICD-10-CM | POA: Insufficient documentation

## 2020-07-31 ENCOUNTER — Other Ambulatory Visit: Payer: Self-pay

## 2020-07-31 DIAGNOSIS — D0512 Intraductal carcinoma in situ of left breast: Secondary | ICD-10-CM

## 2020-07-31 DIAGNOSIS — D563 Thalassemia minor: Secondary | ICD-10-CM

## 2020-07-31 DIAGNOSIS — E871 Hypo-osmolality and hyponatremia: Secondary | ICD-10-CM

## 2020-08-03 ENCOUNTER — Inpatient Hospital Stay: Payer: Medicare PPO | Attending: Oncology

## 2020-08-03 ENCOUNTER — Other Ambulatory Visit: Payer: Self-pay

## 2020-08-03 ENCOUNTER — Inpatient Hospital Stay: Payer: Medicare PPO | Admitting: Oncology

## 2020-08-03 VITALS — BP 150/69 | HR 104 | Resp 20 | Wt 178.0 lb

## 2020-08-03 DIAGNOSIS — Z08 Encounter for follow-up examination after completed treatment for malignant neoplasm: Secondary | ICD-10-CM

## 2020-08-03 DIAGNOSIS — Z79899 Other long term (current) drug therapy: Secondary | ICD-10-CM | POA: Diagnosis not present

## 2020-08-03 DIAGNOSIS — Z7982 Long term (current) use of aspirin: Secondary | ICD-10-CM | POA: Diagnosis not present

## 2020-08-03 DIAGNOSIS — Z85828 Personal history of other malignant neoplasm of skin: Secondary | ICD-10-CM | POA: Insufficient documentation

## 2020-08-03 DIAGNOSIS — D563 Thalassemia minor: Secondary | ICD-10-CM | POA: Diagnosis not present

## 2020-08-03 DIAGNOSIS — Z9013 Acquired absence of bilateral breasts and nipples: Secondary | ICD-10-CM | POA: Insufficient documentation

## 2020-08-03 DIAGNOSIS — Z86000 Personal history of in-situ neoplasm of breast: Secondary | ICD-10-CM | POA: Diagnosis present

## 2020-08-03 DIAGNOSIS — I10 Essential (primary) hypertension: Secondary | ICD-10-CM | POA: Insufficient documentation

## 2020-08-03 DIAGNOSIS — D0512 Intraductal carcinoma in situ of left breast: Secondary | ICD-10-CM

## 2020-08-03 DIAGNOSIS — E871 Hypo-osmolality and hyponatremia: Secondary | ICD-10-CM | POA: Insufficient documentation

## 2020-08-03 LAB — CBC WITH DIFFERENTIAL/PLATELET
Abs Immature Granulocytes: 0.03 10*3/uL (ref 0.00–0.07)
Basophils Absolute: 0.1 10*3/uL (ref 0.0–0.1)
Basophils Relative: 1 %
Eosinophils Absolute: 0.3 10*3/uL (ref 0.0–0.5)
Eosinophils Relative: 5 %
HCT: 35.9 % — ABNORMAL LOW (ref 36.0–46.0)
Hemoglobin: 11.2 g/dL — ABNORMAL LOW (ref 12.0–15.0)
Immature Granulocytes: 1 %
Lymphocytes Relative: 22 %
Lymphs Abs: 1.3 10*3/uL (ref 0.7–4.0)
MCH: 17.7 pg — ABNORMAL LOW (ref 26.0–34.0)
MCHC: 31.2 g/dL (ref 30.0–36.0)
MCV: 56.9 fL — ABNORMAL LOW (ref 80.0–100.0)
Monocytes Absolute: 0.6 10*3/uL (ref 0.1–1.0)
Monocytes Relative: 10 %
Neutro Abs: 3.7 10*3/uL (ref 1.7–7.7)
Neutrophils Relative %: 61 %
Platelets: 362 10*3/uL (ref 150–400)
RBC: 6.31 MIL/uL — ABNORMAL HIGH (ref 3.87–5.11)
RDW: 19 % — ABNORMAL HIGH (ref 11.5–15.5)
WBC: 6 10*3/uL (ref 4.0–10.5)
nRBC: 0 % (ref 0.0–0.2)

## 2020-08-03 LAB — COMPREHENSIVE METABOLIC PANEL
ALT: 19 U/L (ref 0–44)
AST: 23 U/L (ref 15–41)
Albumin: 4.1 g/dL (ref 3.5–5.0)
Alkaline Phosphatase: 51 U/L (ref 38–126)
Anion gap: 7 (ref 5–15)
BUN: 14 mg/dL (ref 8–23)
CO2: 29 mmol/L (ref 22–32)
Calcium: 9.3 mg/dL (ref 8.9–10.3)
Chloride: 92 mmol/L — ABNORMAL LOW (ref 98–111)
Creatinine, Ser: 0.52 mg/dL (ref 0.44–1.00)
GFR, Estimated: >60 mL/min (ref >60)
Glucose, Bld: 110 mg/dL — ABNORMAL HIGH (ref 70–99)
Potassium: 4 mmol/L (ref 3.5–5.1)
Sodium: 128 mmol/L — ABNORMAL LOW (ref 135–145)
Total Bilirubin: 0.5 mg/dL (ref 0.3–1.2)
Total Protein: 7.9 g/dL (ref 6.5–8.1)

## 2020-08-03 NOTE — Progress Notes (Signed)
Hematology/Oncology Consult note [facility]  [phone] [phone]  Patient Care Team: Baxter Hire, MD as PCP - General (Internal Medicine) Leonie Green, MD as Referring Physician (Surgery)   Name of the patient: Sandra Potter  [id number]  06-May-1945   Date of visit: 08/03/20  Diagnosis-history of left breast DCIS ER negative s/p bilateral mastectomy  Chief complaint/ Reason for visit-routine follow-up of left breast DCIS  Heme/Onc history: Patient is a 75 year old female with a history of left breast DCIS ER negative s/p bilateral mastectomy in December 2018.  Pathology showed high-grade DCIS comedo type.  No evidence of invasive carcinoma.  Genetic testing was negative.  She also has a history of thalassemia minor unclear of alpha or beta.  Hemoglobin between 11-12 at baseline  Interval history-patient reports doing well and denies any specific complaints at this time.  Appetite and weight have remained stable.  Denies any lumps or bumps anywhere.  ECOG PS- 1 Pain scale- 0   Review of systems- Review of Systems  Constitutional:  Negative for chills, fever, malaise/fatigue and weight loss.  HENT:  Negative for congestion, ear discharge and nosebleeds.   Eyes:  Negative for blurred vision.  Respiratory:  Negative for cough, hemoptysis, sputum production, shortness of breath and wheezing.   Cardiovascular:  Negative for chest pain, palpitations, orthopnea and claudication.  Gastrointestinal:  Negative for abdominal pain, blood in stool, constipation, diarrhea, heartburn, melena, nausea and vomiting.  Genitourinary:  Negative for dysuria, flank pain, frequency, hematuria and urgency.  Musculoskeletal:  Negative for back pain, joint pain and myalgias.  Skin:  Negative for rash.  Neurological:  Negative for dizziness, tingling, focal weakness, seizures, weakness and headaches.  Endo/Heme/Allergies:  Does not bruise/bleed  easily.  Psychiatric/Behavioral:  Negative for depression and suicidal ideas. The patient does not have insomnia.       Allergies  Allergen Reactions   Dust Mite Extract Other (See Comments) and Cough    Also watery eyes and sinus congestion   Mold Extract [Trichophyton] Other (See Comments) and Cough    Also sinus congestion and watery eyes.   Statins Other (See Comments)    Muscle pain   Ativan [Lorazepam] Anxiety and Other (See Comments)    Panic attacks   E-Mycin [Erythromycin] Rash   Prozac [Fluoxetine Hcl] Anxiety   Valium [Diazepam] Anxiety and Other (See Comments)    Panic attacks   Xanax [Alprazolam] Anxiety     Past Medical History:  Diagnosis Date   Allergy    Anxiety    mild   Arthritis    Breast cancer ([place])    new diagnosis   Cataract    Complication of anesthesia    throat sore after being intubated, suggested to use smaller airway to prevent pain afterwards   Glaucoma    Hypertension    Osteoporosis    Pre-diabetes    Skin cancer    Thalassemia minor    Thalassemia minor      Past Surgical History:  Procedure Laterality Date   ADENOIDECTOMY  1956   APPENDECTOMY     basal cell removal  1995   BREAST BIOPSY Right 1990s   neg. result.    CATARACT EXTRACTION Right 2001   CATARACT EXTRACTION Left 2011   COLONOSCOPY  2005   COLONOSCOPY  2015   MOLE REMOVAL Right 01/08/2017   Procedure: REMOVAL OF SEBORRHEIC KERATOSES, RIGHT BREAST;  Surgeon: Leonie Green, MD;  Location: [facility];  Service: General;  Laterality: Right;   PAROTID GLAND TUMOR EXCISION  2005   RADIOLOGY WITH ANESTHESIA N/A 12/18/2016   Procedure: BREAST MRI WITH AND WITHOUT CONTRAST;  Surgeon: Radiologist, Medication, MD;  Location: [facility];  Service: Radiology;  Laterality: N/A;   SENTINEL NODE BIOPSY Left 01/08/2017   Procedure: SENTINEL NODE BIOPSY;  Surgeon: Leonie Green, MD;  Location: [facility];  Service: General;  Laterality: Left;   SIMPLE MASTECTOMY WITH AXILLARY  SENTINEL NODE BIOPSY Bilateral 01/08/2017   Procedure: SIMPLE MASTECTOMY;  Surgeon: Leonie Green, MD;  Location: [facility];  Service: General;  Laterality: Bilateral;    Social History   Socioeconomic History   Marital status: Single    Spouse name: Not on file   Number of children: Not on file   Years of education: Not on file   Highest education level: Not on file  Occupational History   Not on file  Tobacco Use   Smoking status: Former    Packs/day: 1.00    Years: 33.00    Pack years: 33.00    Types: Cigarettes    Quit date: 09/07/1997    Years since quitting: 22.9   Smokeless tobacco: Never  Vaping Use   Vaping Use: Never used  Substance and Sexual Activity   Alcohol use: Yes    Alcohol/week: 5.0 - 7.0 standard drinks    Types: 5 - 7 Glasses of wine per week   Drug use: No   Sexual activity: Never  Other Topics Concern   Not on file  Social History Narrative   Not on file   Social Determinants of Health   Financial Resource Strain: Not on file  Food Insecurity: Not on file  Transportation Needs: Not on file  Physical Activity: Not on file  Stress: Not on file  Social Connections: Not on file  Intimate Partner Violence: Not on file    Family History  Problem Relation Age of Onset   Breast cancer Mother    Lymphoma Mother    Kidney cancer Father    Endometrial cancer Sister    Endometrial cancer Maternal Grandmother      Current Outpatient Medications:    aspirin EC 81 MG tablet, Take 81 mg by mouth daily., Disp: , Rfl:    Calcium Carbonate-Vit D-Min (CALTRATE 600+D PLUS PO), Take 1 tablet 2 (two) times daily by mouth. , Disp: , Rfl:    cyclobenzaprine (FLEXERIL) 5 MG tablet, Take 5 mg 3 (three) times daily as needed by mouth for muscle spasms. , Disp: , Rfl:    diphenhydrAMINE (BENADRYL) 25 mg capsule, Take 25 mg daily as needed by mouth for allergies. , Disp: , Rfl:    ibuprofen (ADVIL,MOTRIN) 200 MG tablet, Take 400 mg every 6 (six) hours as  needed by mouth for headache or moderate pain. , Disp: , Rfl:    Misc Natural Products (GLUCOSAMINE CHOND COMPLEX/MSM) TABS, Take 2 tablets 2 (two) times daily by mouth. Joint Advantage, Disp: , Rfl:    Multiple Vitamins-Minerals (CENTRUM SILVER ULTRA WOMENS) TABS, Take 1 tablet by mouth daily., Disp: , Rfl:    oxymetazoline (AFRIN) 0.05 % nasal spray, Place 2 sprays 2 (two) times daily as needed into the nose for congestion (mostly at bedtime). , Disp: , Rfl:    sodium chloride (OCEAN) 0.65 % SOLN nasal spray, Place 2 sprays as needed into both nostrils for congestion. , Disp: , Rfl:    TH COD LIVER OIL CAPS, Take 1 tablet by  mouth daily., Disp: , Rfl:    timolol (TIMOPTIC) 0.5 % ophthalmic solution, Place 1 drop into both eyes daily., Disp: , Rfl:    triamterene-hydrochlorothiazide (DYAZIDE) 37.5-25 MG capsule, Take 1 capsule by mouth daily., Disp: , Rfl:    vitamin E 400 UNIT capsule, Take 400 Units by mouth daily., Disp: , Rfl:    butabarbital (BUTISOL) 30 MG tablet, Take 30 mg 2 (two) times daily as needed by mouth (for anxiety).  (Patient not taking: Reported on 08/03/2020), Disp: , Rfl:   Physical exam:  Vitals:   08/03/20 1315  BP: (!) 150/69  Pulse: (!) 104  Resp: 20  SpO2: 100%  Weight: 178 lb 0.3 oz (80.7 kg)   Physical Exam Constitutional:      General: She is not in acute distress. Cardiovascular:     Rate and Rhythm: Normal rate and regular rhythm.     Heart sounds: Normal heart sounds.  Pulmonary:     Effort: Pulmonary effort is normal.     Breath sounds: Normal breath sounds.  Abdominal:     General: Bowel sounds are normal.     Palpations: Abdomen is soft.  Skin:    General: Skin is warm and dry.  Neurological:     Mental Status: She is alert and oriented to person, place, and time.  Patient is s/p bilateral mastectomy without reconstruction and a well-healed surgical scar.  No evidence of chest wall recurrence.  No palpable bilateral axillary adenopathy.  Multiple  nevi noted over anterior abdominal wall.  CMP Latest Ref Rng & Units 08/03/2020  Glucose 70 - 99 mg/dL 110(H)  BUN 8 - 23 mg/dL 14  Creatinine 0.44 - 1.00 mg/dL 0.52  Sodium 135 - 145 mmol/L 128(L)  Potassium 3.5 - 5.1 mmol/L 4.0  Chloride 98 - 111 mmol/L 92(L)  CO2 22 - 32 mmol/L 29  Calcium 8.9 - 10.3 mg/dL 9.3  Total Protein 6.5 - 8.1 g/dL 7.9  Total Bilirubin 0.3 - 1.2 mg/dL 0.5  Alkaline Phos 38 - 126 U/L 51  AST 15 - 41 U/L 23  ALT 0 - 44 U/L 19   CBC Latest Ref Rng & Units 08/03/2020  WBC 4.0 - 10.5 K/uL 6.0  Hemoglobin 12.0 - 15.0 g/dL 11.2(L)  Hematocrit 36.0 - 46.0 % 35.9(L)  Platelets 150 - 400 K/uL 362     Assessment and plan- Patient is a 75 y.o. female with history of left breast DCIS ER negative s/p bilateral mastectomy in December 2018 here for routine follow-up  Patient follows up with GYN as well as surgery Dr. Peyton Najjar for yearly chest wall exams.  She is not on any hormone therapy given that she was ER negative.  She is s/p bilateral mastectomy and therefore does not require any surveillance mammograms.  She does not require oncology follow-up at this time and can continue to follow-up with GYN and surgery on a yearly basis.  She can be referred to [place] in the future if questions or concerns arise  Hyponatremia: Chronic being followed by Dr. Edwina Barth  Multiple nevi: Patient follows up with dermatology   Visit Diagnosis 1. Encounter for follow-up surveillance of ductal carcinoma in situ (DCIS) of breast      Dr. Randa Evens, MD, MPH [facility] [phone] 08/03/2020 3:12 PM

## 2020-10-25 ENCOUNTER — Ambulatory Visit
Admission: RE | Admit: 2020-10-25 | Discharge: 2020-10-25 | Disposition: A | Discharge status: Home / Self Care | Payer: Medicare PPO | Source: Ambulatory Visit | Attending: Gastroenterology | Admitting: Gastroenterology

## 2020-10-25 ENCOUNTER — Ambulatory Visit: Payer: Medicare PPO | Admitting: Registered Nurse

## 2020-10-25 ENCOUNTER — Other Ambulatory Visit: Payer: Self-pay

## 2020-10-25 ENCOUNTER — Encounter
Admission: RE | Disposition: A | Discharge status: Home / Self Care | Payer: Self-pay | Source: Ambulatory Visit | Attending: Gastroenterology

## 2020-10-25 ENCOUNTER — Encounter: Payer: Self-pay | Admitting: *Deleted

## 2020-10-25 DIAGNOSIS — Z79899 Other long term (current) drug therapy: Secondary | ICD-10-CM | POA: Insufficient documentation

## 2020-10-25 DIAGNOSIS — Z85828 Personal history of other malignant neoplasm of skin: Secondary | ICD-10-CM | POA: Insufficient documentation

## 2020-10-25 DIAGNOSIS — Z881 Allergy status to other antibiotic agents status: Secondary | ICD-10-CM | POA: Insufficient documentation

## 2020-10-25 DIAGNOSIS — K5730 Diverticulosis of large intestine without perforation or abscess without bleeding: Secondary | ICD-10-CM | POA: Diagnosis not present

## 2020-10-25 DIAGNOSIS — Z1211 Encounter for screening for malignant neoplasm of colon: Secondary | ICD-10-CM | POA: Insufficient documentation

## 2020-10-25 DIAGNOSIS — Z853 Personal history of malignant neoplasm of breast: Secondary | ICD-10-CM | POA: Diagnosis not present

## 2020-10-25 DIAGNOSIS — Z800 Family history of malignant neoplasm of digestive organs: Secondary | ICD-10-CM | POA: Diagnosis not present

## 2020-10-25 DIAGNOSIS — Z888 Allergy status to other drugs, medicaments and biological substances status: Secondary | ICD-10-CM | POA: Diagnosis not present

## 2020-10-25 DIAGNOSIS — Z8371 Family history of colonic polyps: Secondary | ICD-10-CM | POA: Insufficient documentation

## 2020-10-25 DIAGNOSIS — Z7982 Long term (current) use of aspirin: Secondary | ICD-10-CM | POA: Insufficient documentation

## 2020-10-25 DIAGNOSIS — D120 Benign neoplasm of cecum: Secondary | ICD-10-CM | POA: Diagnosis not present

## 2020-10-25 HISTORY — DX: Hyperlipidemia, unspecified: E78.5

## 2020-10-25 HISTORY — DX: Other specified congenital malformations of intestine: Q43.8

## 2020-10-25 HISTORY — DX: Other specified disorders of bone density and structure, unspecified site: M85.80

## 2020-10-25 HISTORY — PX: COLONOSCOPY WITH PROPOFOL: SHX5780

## 2020-10-25 HISTORY — DX: Diverticulosis of intestine, part unspecified, without perforation or abscess without bleeding: K57.90

## 2020-10-25 SURGERY — COLONOSCOPY WITH PROPOFOL
Anesthesia: General

## 2020-10-25 MED ORDER — PROPOFOL 500 MG/50ML IV EMUL
INTRAVENOUS | Status: DC | PRN
  Administered 2020-10-25: 100 ug/kg/min via INTRAVENOUS

## 2020-10-25 MED ORDER — PROPOFOL 10 MG/ML IV BOLUS
INTRAVENOUS | Status: DC | PRN
  Administered 2020-10-25: 20 mg via INTRAVENOUS
  Administered 2020-10-25: 60 mg via INTRAVENOUS
  Administered 2020-10-25: 40 mg via INTRAVENOUS
  Administered 2020-10-25: 20 mg via INTRAVENOUS

## 2020-10-25 MED ORDER — SODIUM CHLORIDE 0.9 % IV SOLN: INTRAVENOUS | Status: DC

## 2020-10-25 NOTE — Transfer of Care (Signed)
Immediate Anesthesia Transfer of Care Note  Patient: Sandra Potter  Procedure(s) Performed: COLONOSCOPY WITH PROPOFOL  Patient Location: PACU and Endoscopy Unit  Anesthesia Type:General  Level of Consciousness: drowsy  Airway & Oxygen Therapy: Patient Spontanous Breathing  Post-op Assessment: Report given to RN and Post -op Vital signs reviewed and stable  Post vital signs: stable  Last Vitals:  Vitals Value Taken Time  BP 106/53 10/25/20 1007  Temp 36.1 C 10/25/20 1007  Pulse 93 10/25/20 1009  Resp 19 10/25/20 1009  SpO2 98 % 10/25/20 1009  Vitals shown include unvalidated device data.  Last Pain:  Vitals:   10/25/20 1007  TempSrc: Temporal  PainSc:          Complications: No notable events documented.

## 2020-10-25 NOTE — Anesthesia Postprocedure Evaluation (Signed)
Anesthesia Post Note  Patient: Sandra Potter  Procedure(s) Performed: COLONOSCOPY WITH PROPOFOL  Patient location during evaluation: Endoscopy Anesthesia Type: General Level of consciousness: awake and alert Pain management: pain level controlled Vital Signs Assessment: post-procedure vital signs reviewed and stable Respiratory status: spontaneous breathing, nonlabored ventilation, respiratory function stable and patient connected to nasal cannula oxygen Cardiovascular status: blood pressure returned to baseline and stable Postop Assessment: no apparent nausea or vomiting Anesthetic complications: no   No notable events documented.   Last Vitals:  Vitals:   10/25/20 1017 10/25/20 1027  BP: 129/69 (!) 159/90  Pulse: 95 92  Resp: 19 12  Temp:    SpO2: 100% 100%    Last Pain:  Vitals:   10/25/20 1017  TempSrc:   PainSc: 0-No pain                 Martha Clan

## 2020-10-25 NOTE — Op Note (Signed)
[facility] Patient Name: Sandra Potter Procedure Date: 10/25/2020 9:32 AM MRN: [id number] Account #: [id number] Date of Birth: Sep 08, 1945 Admit Type: Outpatient Age: 75 Room: [facility] ENDO ROOM 1 Gender: Female Note Status: Finalized Instrument Name: Park Meo [id number] Procedure:             Colonoscopy Indications:           Colon cancer screening in patient at increased risk:                         Family history of 1st-degree relative with colon                         polyps, Colon cancer screening in patient at increased                         risk: Family history of colorectal cancer in multiple                         2nd degree relatives Providers:             Andrey Farmer MD, MD Referring MD:          Andres Labrum, MD (Referring MD) Medicines:             Monitored Anesthesia Care Complications:         No immediate complications. Estimated blood loss:                         Minimal. Procedure:             Pre-Anesthesia Assessment:                        - Prior to the procedure, a History and Physical was                         performed, and patient medications and allergies were                         reviewed. The patient is competent. The risks and                         benefits of the procedure and the sedation options and                         risks were discussed with the patient. All questions                         were answered and informed consent was obtained.                         Patient identification and proposed procedure were                         verified by the physician, the nurse, the anesthetist                         and the technician in the endoscopy suite. Mental  Status Examination: alert and oriented. Airway                         Examination: normal oropharyngeal airway and neck                         mobility. Respiratory Examination: clear to                          auscultation. CV Examination: normal. Prophylactic                         Antibiotics: The patient does not require prophylactic                         antibiotics. Prior Anticoagulants: The patient has                         taken no previous anticoagulant or antiplatelet                         agents. ASA Grade Assessment: II - A patient with mild                         systemic disease. After reviewing the risks and                         benefits, the patient was deemed in satisfactory                         condition to undergo the procedure. The anesthesia                         plan was to use monitored anesthesia care (MAC).                         Immediately prior to administration of medications,                         the patient was re-assessed for adequacy to receive                         sedatives. The heart rate, respiratory rate, oxygen                         saturations, blood pressure, adequacy of pulmonary                         ventilation, and response to care were monitored                         throughout the procedure. The physical status of the                         patient was re-assessed after the procedure.                        After obtaining informed consent, the colonoscope was  passed under direct vision. Throughout the procedure,                         the patient's blood pressure, pulse, and oxygen                         saturations were monitored continuously. The                         Colonoscope was introduced through the anus and                         advanced to the the cecum, identified by appendiceal                         orifice and ileocecal valve. The colonoscopy was                         technically difficult and complex due to a tortuous                         colon. The patient tolerated the procedure well. The                         quality of the bowel preparation was adequate to                          identify polyps. Findings:      The perianal and digital rectal examinations were normal.      A 1 mm polyp was found in the cecum. The polyp was sessile. The polyp       was removed with a jumbo cold forceps. Resection and retrieval were       complete. Estimated blood loss was minimal.      A single small-mouthed diverticulum was found in the sigmoid colon.      The exam was otherwise without abnormality on direct and retroflexion       views. Impression:            - One 1 mm polyp in the cecum, removed with a jumbo                         cold forceps. Resected and retrieved.                        - Diverticulosis in the sigmoid colon.                        - The examination was otherwise normal on direct and                         retroflexion views. Recommendation:        - Discharge patient to home.                        - Resume previous diet.                        - Continue present medications.                        -  Await pathology results.                        - Repeat colonoscopy is not recommended due to current                         age (72 years or older) for surveillance.                        - Return to referring physician as previously                         scheduled. Procedure Code(s):     --- Professional ---                        [phone], Colonoscopy, flexible; with biopsy, single or                         multiple Diagnosis Code(s):     --- Professional ---                        Z83.71, Family history of colonic polyps                        Z80.0, Family history of malignant neoplasm of                         digestive organs                        K63.5, Polyp of colon                        K57.30, Diverticulosis of large intestine without                         perforation or abscess without bleeding CPT copyright 2019 [employer]. All rights reserved. The codes documented in this report are preliminary  and upon coder review may  be revised to meet current compliance requirements. Andrey Farmer MD, MD 10/25/2020 10:12:11 AM Number of Addenda: 0 Note Initiated On: 10/25/2020 9:32 AM Scope Withdrawal Time: 0 hours 11 minutes 23 seconds  Total Procedure Duration: 0 hours 26 minutes 24 seconds  Estimated Blood Loss:  Estimated blood loss was minimal.      [facility]

## 2020-10-25 NOTE — Anesthesia Preprocedure Evaluation (Signed)
Anesthesia Evaluation  Patient identified by MRN, date of birth, ID band Patient awake    Reviewed: Allergy & Precautions, NPO status , Patient's Chart, lab work & pertinent test results  History of Anesthesia Complications Negative for: history of anesthetic complications  Airway Mallampati: II       Dental  (+) Teeth Intact, Dental Advidsory Given   Pulmonary neg shortness of breath, neg COPD, neg recent URI, former smoker,    breath sounds clear to auscultation       Cardiovascular Exercise Tolerance: Good hypertension, Pt. on medications (-) angina(-) Past MI and (-) Cardiac Stents (-) dysrhythmias (-) Valvular Problems/Murmurs Rhythm:Regular     Neuro/Psych PSYCHIATRIC DISORDERS Anxiety negative neurological ROS     GI/Hepatic negative GI ROS, Neg liver ROS,   Endo/Other  negative endocrine ROS  Renal/GU negative Renal ROS     Musculoskeletal negative musculoskeletal ROS (+)   Abdominal Normal abdominal exam  (+)   Peds negative pediatric ROS (+)  Hematology  (+) Blood dyscrasia, anemia ,   Anesthesia Other Findings Past Medical History: No date: Allergy No date: Anxiety     Comment:  mild No date: Arthritis No date: Breast cancer (HCC)     Comment:  new diagnosis No date: Cataract No date: Complication of anesthesia     Comment:  throat sore after being intubated, suggested to use               smaller airway to prevent pain afterwards No date: Diverticulosis No date: Glaucoma No date: Hyperlipidemia No date: Hypertension No date: Osteopenia No date: Osteoporosis No date: Pre-diabetes No date: Redundant colon No date: Skin cancer 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

## 2020-10-25 NOTE — Interval H&P Note (Signed)
History and Physical Interval Note:  10/25/2020 9:33 AM  Sandra Potter  has presented today for surgery, with the diagnosis of HX OF POLYPS.  The various methods of treatment have been discussed with the patient and family. After consideration of risks, benefits and other options for treatment, the patient has consented to  Procedure(s): COLONOSCOPY WITH PROPOFOL (N/A) as a surgical intervention.  The patient's history has been reviewed, patient examined, no change in status, stable for surgery.  I have reviewed the patient's chart and labs.  Questions were answered to the patient's satisfaction.     Lesly Rubenstein  Ok to proceed with colonoscopy

## 2020-10-25 NOTE — H&P (Signed)
Outpatient short stay form Pre-procedure 10/25/2020  Lesly Rubenstein, MD  Primary Physician: Baxter Hire, MD  Reason for visit:  Screening  History of present illness:   75 y/o lady with history of breast cancer and two family members with history of colon cancer. Patient's last colonoscopy was normal in 2015. No blood thinners. History of appendectomy. No significant symptoms.    Current Facility-Administered Medications:    0.9 %  sodium chloride infusion, , Intravenous, Continuous, Lanyah Spengler, Hilton Cork, MD, Last Rate: 20 mL/hr at 10/25/20 0915, New Bag at 10/25/20 0915  Medications Prior to Admission  Medication Sig Dispense Refill Last Dose   aspirin EC 81 MG tablet Take 81 mg by mouth daily.   Past Week   butabarbital (BUTISOL) 30 MG tablet Take 30 mg by mouth 2 (two) times daily as needed (for anxiety).   Past Month   Calcium Carbonate-Vit D-Min (CALTRATE 600+D PLUS PO) Take 1 tablet 2 (two) times daily by mouth.    10/24/2020   cyclobenzaprine (FLEXERIL) 5 MG tablet Take 5 mg 3 (three) times daily as needed by mouth for muscle spasms.    Past Week   diphenhydrAMINE (BENADRYL) 25 mg capsule Take 25 mg daily as needed by mouth for allergies.    Past Month   ibuprofen (ADVIL,MOTRIN) 200 MG tablet Take 400 mg every 6 (six) hours as needed by mouth for headache or moderate pain.    Past Week   Misc Natural Products (GLUCOSAMINE CHOND COMPLEX/MSM) TABS Take 2 tablets 2 (two) times daily by mouth. Joint [employer]vantage   Past Week   Multiple Vitamins-Minerals (CENTRUM SILVER ULTRA WOMENS) TABS Take 1 tablet by mouth daily.   10/24/2020   TH COD LIVER OIL CAPS Take 1 tablet by mouth daily.   10/24/2020   timolol (TIMOPTIC) 0.5 % ophthalmic solution Place 1 drop into both eyes daily.   10/24/2020   triamterene-hydrochlorothiazide (DYAZIDE) 37.5-25 MG capsule Take 1 capsule by mouth daily.   10/24/2020   vitamin E 400 UNIT capsule Take 400 Units by mouth daily.   10/24/2020   oxymetazoline  (AFRIN) 0.05 % nasal spray Place 2 sprays 2 (two) times daily as needed into the nose for congestion (mostly at bedtime).       sodium chloride (OCEAN) 0.65 % SOLN nasal spray Place 2 sprays as needed into both nostrils for congestion.         Allergies  Allergen Reactions   Dust Mite Extract Other (See Comments) and Cough    Also watery eyes and sinus congestion   Mold Extract [Trichophyton] Other (See Comments) and Cough    Also sinus congestion and watery eyes.   Statins Other (See Comments)    Muscle pain   Ativan [Lorazepam] Anxiety and Other (See Comments)    Panic attacks   E-Mycin [Erythromycin] Rash   Prozac [Fluoxetine Hcl] Anxiety   Valium [Diazepam] Anxiety and Other (See Comments)    Panic attacks   Xanax [Alprazolam] Anxiety     Past Medical History:  Diagnosis Date   Allergy    Anxiety    mild   Arthritis    Breast cancer ([place])    new diagnosis   Cataract    Complication of anesthesia    throat sore after being intubated, suggested to use smaller airway to prevent pain afterwards   Diverticulosis    Glaucoma    Hyperlipidemia    Hypertension    Osteopenia    Osteoporosis    Pre-diabetes  Redundant colon    Skin cancer    Thalassemia minor    Thalassemia minor     Review of systems:  Otherwise negative.    Physical Exam  Gen: Alert, oriented. Appears stated age.  HEENT: PERRLA. Lungs: No respiratory distress CV: RRR Abd: soft, benign, no masses Ext: No edema    Planned procedures: Proceed with colonoscopy. The patient understands the nature of the planned procedure, indications, risks, alternatives and potential complications including but not limited to bleeding, infection, perforation, damage to internal organs and possible oversedation/side effects from anesthesia. The patient agrees and gives consent to proceed.  Please refer to procedure notes for findings, recommendations and patient disposition/instructions.     Lesly Rubenstein, MD [facility]

## 2020-10-26 ENCOUNTER — Encounter: Payer: Self-pay | Admitting: Gastroenterology

## 2020-10-26 LAB — SURGICAL PATHOLOGY
